# Patient Record
Sex: Female | Born: 1960 | Race: White | Hispanic: No | Marital: Single | Smoking: Never smoker
Health system: Southern US, Community
[De-identification: ages and names within clinical notes are randomized; demographics above are authoritative.]

## PROBLEM LIST (undated history)

## (undated) DIAGNOSIS — M1990 Unspecified osteoarthritis, unspecified site: Secondary | ICD-10-CM

## (undated) DIAGNOSIS — E119 Type 2 diabetes mellitus without complications: Secondary | ICD-10-CM

## (undated) DIAGNOSIS — T7840XA Allergy, unspecified, initial encounter: Secondary | ICD-10-CM

## (undated) HISTORY — DX: Type 2 diabetes mellitus without complications: E11.9

## (undated) HISTORY — PX: WISDOM TOOTH EXTRACTION: SHX21

## (undated) HISTORY — PX: NO PAST SURGERIES: SHX2092

## (undated) HISTORY — DX: Unspecified osteoarthritis, unspecified site: M19.90

## (undated) HISTORY — DX: Allergy, unspecified, initial encounter: T78.40XA

---

## 1997-08-20 ENCOUNTER — Other Ambulatory Visit: Admission: RE | Admit: 1997-08-20 | Discharge: 1997-08-20 | Payer: Self-pay | Admitting: *Deleted

## 1998-10-05 ENCOUNTER — Ambulatory Visit (HOSPITAL_COMMUNITY): Admission: RE | Admit: 1998-10-05 | Discharge: 1998-10-05 | Payer: Self-pay | Admitting: *Deleted

## 1998-10-23 ENCOUNTER — Other Ambulatory Visit: Admission: RE | Admit: 1998-10-23 | Discharge: 1998-10-23 | Payer: Self-pay | Admitting: *Deleted

## 2003-11-19 ENCOUNTER — Ambulatory Visit (HOSPITAL_COMMUNITY): Admission: RE | Admit: 2003-11-19 | Discharge: 2003-11-19 | Payer: Self-pay | Admitting: Internal Medicine

## 2007-02-13 ENCOUNTER — Ambulatory Visit: Payer: Self-pay | Admitting: Internal Medicine

## 2007-02-13 LAB — CONVERTED CEMR LAB
AFP-Tumor Marker: 2.4 ng/mL (ref 0.0–8.0)
ALT: 61 units/L — ABNORMAL HIGH (ref 0–35)
AST: 35 units/L (ref 0–37)
Albumin: 3.9 g/dL (ref 3.5–5.2)
Alkaline Phosphatase: 78 units/L (ref 39–117)
Bilirubin, Direct: 0.1 mg/dL (ref 0.0–0.3)
Ferritin: 109.9 ng/mL (ref 10.0–291.0)
HCV Ab: NEGATIVE
Hep B S Ab: NEGATIVE
Hepatitis B Surface Ag: NEGATIVE
IgG (Immunoglobin G), Serum: 832 mg/dL (ref 694–1618)
IgM, Serum: 149 mg/dL (ref 60–263)
Total Bilirubin: 0.6 mg/dL (ref 0.3–1.2)
Total Protein: 7.2 g/dL (ref 6.0–8.3)
ds DNA Ab: <1 (ref <5)

## 2007-02-16 ENCOUNTER — Ambulatory Visit (HOSPITAL_COMMUNITY): Admission: RE | Admit: 2007-02-16 | Discharge: 2007-02-16 | Payer: Self-pay | Admitting: Internal Medicine

## 2007-03-27 ENCOUNTER — Ambulatory Visit: Payer: Self-pay | Admitting: Internal Medicine

## 2007-07-11 DIAGNOSIS — R945 Abnormal results of liver function studies: Secondary | ICD-10-CM | POA: Insufficient documentation

## 2007-07-11 DIAGNOSIS — J301 Allergic rhinitis due to pollen: Secondary | ICD-10-CM | POA: Insufficient documentation

## 2007-07-11 DIAGNOSIS — K7689 Other specified diseases of liver: Secondary | ICD-10-CM | POA: Insufficient documentation

## 2007-10-11 ENCOUNTER — Ambulatory Visit (HOSPITAL_COMMUNITY): Admission: RE | Admit: 2007-10-11 | Discharge: 2007-10-11 | Payer: Self-pay | Admitting: Obstetrics and Gynecology

## 2010-07-30 ENCOUNTER — Other Ambulatory Visit (HOSPITAL_COMMUNITY): Payer: Self-pay | Admitting: Obstetrics and Gynecology

## 2010-09-28 NOTE — Assessment & Plan Note (Signed)
[facility]                         GASTROENTEROLOGY OFFICE NOTE   NAME:Mathieson, LAMANDA RUDDER                  MRN:          [id number]  DATE:03/27/2007                            DOB:          07-Jan-1961    SUBJECTIVE:  Ms. Amodio is a very nice 50 year old white female  patient of Dr. Georgia Lopes who was initially referred for evaluation of  abnormal liver function tests.  My initial consultation was dictated on  February 13, 2007.  Since then, the patient has had many liver function  tests pertaining to hereditary autoimmune or genetic diseases.  All of  these tests have been negative including hepatitis A, B and C  serologies.  She initially had a positive ANA titer 1:125 but her anti-  DNA antibody was negative and her total IgG level was normal.  Also, her  liver function tests have improved markedly to normal AST and ALT of  only 61.  The patient remains asymptomatic.  The upper abdominal  ultrasound confirmed presence of fatty infiltration of the liver with  normal gallbladder, no focal lesions and no evidence of portal  hypertension.   PHYSICAL EXAMINATION:  VITAL SIGNS:  Blood pressure 136/94. Pulse 112.  Weight 187 pounds.  GENERAL APPEARANCE: Patient was in no distress.  There were no stigmata  of chronic liver disease.  ABDOMEN:  Soft, nontender with liver edge at costal margin, distended  about 9 to 10 cm.  Splenic tip not palpable.  There was no tenderness  overlying the liver.   IMPRESSION:  A 50 year old white female with abnormal liver function  tests which have improved.  She has mild fatty liver without evidence of  significant hepatocellular dysfunction.  Her immune parameters are  negative including IgG, antismooth muscle antibody, antimitochondrial  antibody, and anti-DNA titer.  She has low positive titer of ANA.  Even  if she had a mild component of autoimmune liver disease, her liver  function abnormalities are not  severe enough to use immunomodulators  such as Imuran.   PLAN:  1. I would favor following her liver function tests over the next two      years, every six months, and follow the transaminases as to      fluctuation.  2. I asked the patient to lose about 20 pounds  She has started      walking and trying to lose weight.  She will also be following a      low fat carbohydrate-modified diet to improve her lipid profile.  3. I will see her in the office in about a year.  4. It is entirely possible that we will end up doing a liver biopsy at      some point should her liver function tests deteriorate again.     Hedwig Morton. Juanda Chance, MD  Electronically Signed   DMB/MedQ  DD: 03/27/2007  DT: 03/28/2007  Job #: [id number]   cc:   Elvina Sidle, M.D.

## 2010-09-28 NOTE — Assessment & Plan Note (Signed)
[facility]                         GASTROENTEROLOGY OFFICE NOTE   NAME:THORNHILLStarlyn, Droge                  MRN:          [id number]  DATE:02/13/2007                            DOB:          1960-07-08    Ms. Ayars is a very nice 50 -year-old patient of Dr.  Georgia Lopes  who is referred here because of abnormal transaminases which were found  on December 02, 2006, while the patient was evaluated for a rash. The  etiology of the rash was never clearly found, but the patient was found  to have a positive ANA titer of 125. The rash has since then has  disappeared and the patient has no residual of lesions. Her AST was 78  and ALT was 108 with normal alkaline phosphatase of 112 and normal total  and direct bilirubin as well as normal serum albumin of 4.6. The patient  has no specific symptoms. Her level of energy has been good. She denies  jaundice, easy bruising, abdominal pains. She denies any arthralgias.  There is no family history of liver disease or gallbladder disease. She  has never received blood transfusions and has never donated blood. She  has no GI symptoms of food intolerance or dyspepsia. She has really  never had any comprehensive blood tests since she has never been  pregnant and then the only time she saw a physician was at age of 82 when  she ran though a glass door and needed some stitches.   CURRENT MEDICATIONS:  1. Viactiv one daily.  2. Potassium over-the-counter.  3. Generic Claritin D.  4. She has never taken a non-steroidal agent, Tylenol or aspirin.  5. She in the past took vitamin E, but did not take excess vitamin A.   PAST MEDICAL HISTORY:  Only significant for allergies and sinus trouble  and question of elevated blood pressure for which she was given a  prescription but has not had it filled yet.   FAMILY HISTORY:  Negative for colon cancer. Positive for heart disease  in her mother who also had a kidney cancer and had  a nephrectomy.   SOCIAL HISTORY:  She is single with four years of college. Never had any  children. She works as a Catering manager. She drinks alcohol only six times a  year and does not smoke.   REVIEW OF SYSTEMS:  Weight is stable. She __________ skin rash which she  has attributed to allergies or contact dermatitis.   PHYSICAL EXAMINATION:  Blood pressure 112/80, pulse 96 and weight 190  pounds. The patient was alert, oriented in no distress. Very  cooperative.  SKIN: Was warm and dry with two red spots on her back which blanch with  pressure and both questionably consistent with AVMs. There was no palmar  erythema and no other AV malformations.  Oral cavity was normal.  NECK: Supple __________ adenopathy.  Sclerae was not icteric.  LUNGS:  Clear to auscultation.  COR: With normal S1, normal S2.  ABDOMEN: Soft, nontender. No scars. Bowel sounds were normoactive to  somewhat hypoactive. Liver edge by percussion was at costal margin. By  percussion, liver  span was about 9-cm. There was no pain on pounding of  the liver. Splenic tip not palpable.  EXTREMITIES: No edema.   IMPRESSION:  A 50 year old white female with abnormal transaminases, ALT  greater than AST on December 02, 2006. She has no symptoms of liver disease  and no stigmata of chronic liver disease on physical examination. She  has positive serology marker for autoimmune disease with ANA titer of  __________ 125. We need to rule out possibility of autoimmune liver  disease. She does not show any stigmata of portal hypertension or  cirrhosis. Need to rule out hepatitis A, B, C or hereditary liver  diseases such as alpha-1 antitrypsin deficiency, heterozygous state or  hemachromatosis heterozygous state.   PLAN:  1. Upper abdominal ultrasound to visualize the liver and rule out      space occupying lesion.  2. Repeat the liver function tests today. Obtain anti-DNA, IgG level      as well as IgM level, __________ antibody and  smooth muscle      antibodies, pro-thrombin time as well as __________ level. She will      come back in about six weeks to discuss results of her liver tests      and her ultrasound. Depending on the results, we will take further      action, either follow the liver function tests or proceed with      liver biopsies.     Hedwig Morton. Juanda Chance, MD  Electronically Signed    DMB/MedQ  DD: 02/13/2007  DT: 02/13/2007  Job #: [id number]   cc:   Elvina Sidle, M.D.

## 2010-10-29 ENCOUNTER — Telehealth: Payer: Self-pay | Admitting: Internal Medicine

## 2010-10-29 NOTE — Telephone Encounter (Signed)
Patient calling to report she has noticed a "tender spot in middle of stomach" that is sore when pushed on for the last few weeks. She had lots of sneezing yesterday due to allergies and it is more sore today. Reports a 24 hour episode, of liquid diarrhea and bloating last week but no problems since then. Patient instructed to see PCP or Urgent Care to evaluate and then see GI if needed.

## 2010-10-29 NOTE — Telephone Encounter (Signed)
I agree , this is not a specific GI problem

## 2011-01-11 ENCOUNTER — Other Ambulatory Visit (HOSPITAL_COMMUNITY): Payer: Self-pay | Admitting: Obstetrics and Gynecology

## 2011-01-11 DIAGNOSIS — Z1231 Encounter for screening mammogram for malignant neoplasm of breast: Secondary | ICD-10-CM

## 2011-01-13 ENCOUNTER — Ambulatory Visit (HOSPITAL_COMMUNITY)
Admission: RE | Admit: 2011-01-13 | Discharge: 2011-01-13 | Disposition: A | Discharge status: Home / Self Care | Payer: BC Managed Care – PPO | Source: Ambulatory Visit | Attending: Obstetrics and Gynecology | Admitting: Obstetrics and Gynecology

## 2011-01-13 DIAGNOSIS — Z1231 Encounter for screening mammogram for malignant neoplasm of breast: Secondary | ICD-10-CM | POA: Insufficient documentation

## 2012-07-06 ENCOUNTER — Other Ambulatory Visit: Payer: Self-pay | Admitting: Obstetrics and Gynecology

## 2012-07-06 DIAGNOSIS — Z1231 Encounter for screening mammogram for malignant neoplasm of breast: Secondary | ICD-10-CM

## 2012-08-01 ENCOUNTER — Ambulatory Visit
Admission: RE | Admit: 2012-08-01 | Discharge: 2012-08-01 | Disposition: A | Discharge status: Home / Self Care | Payer: BC Managed Care – PPO | Source: Ambulatory Visit | Attending: Obstetrics and Gynecology | Admitting: Obstetrics and Gynecology

## 2012-08-01 DIAGNOSIS — Z1231 Encounter for screening mammogram for malignant neoplasm of breast: Secondary | ICD-10-CM

## 2014-05-15 LAB — TSH: TSH: 1.65 u[IU]/mL (ref 0.41–5.90)

## 2014-08-15 ENCOUNTER — Encounter: Payer: Self-pay | Admitting: Family

## 2014-08-15 ENCOUNTER — Ambulatory Visit (INDEPENDENT_AMBULATORY_CARE_PROVIDER_SITE_OTHER): Payer: BLUE CROSS/BLUE SHIELD | Admitting: Family

## 2014-08-15 ENCOUNTER — Other Ambulatory Visit: Payer: Self-pay

## 2014-08-15 ENCOUNTER — Other Ambulatory Visit (INDEPENDENT_AMBULATORY_CARE_PROVIDER_SITE_OTHER): Payer: BLUE CROSS/BLUE SHIELD

## 2014-08-15 VITALS — BP 144/100 | HR 84 | Temp 98.1°F | Ht 68.75 in | Wt 175.2 lb

## 2014-08-15 DIAGNOSIS — E559 Vitamin D deficiency, unspecified: Secondary | ICD-10-CM

## 2014-08-15 DIAGNOSIS — L659 Nonscarring hair loss, unspecified: Secondary | ICD-10-CM | POA: Diagnosis not present

## 2014-08-15 DIAGNOSIS — E119 Type 2 diabetes mellitus without complications: Secondary | ICD-10-CM | POA: Diagnosis not present

## 2014-08-15 DIAGNOSIS — R030 Elevated blood-pressure reading, without diagnosis of hypertension: Secondary | ICD-10-CM | POA: Insufficient documentation

## 2014-08-15 DIAGNOSIS — I10 Essential (primary) hypertension: Secondary | ICD-10-CM

## 2014-08-15 DIAGNOSIS — IMO0001 Reserved for inherently not codable concepts without codable children: Secondary | ICD-10-CM

## 2014-08-15 LAB — COMPREHENSIVE METABOLIC PANEL
ALT: 13 U/L (ref 0–35)
AST: 12 U/L (ref 0–37)
Albumin: 4.4 g/dL (ref 3.5–5.2)
Alkaline Phosphatase: 56 U/L (ref 39–117)
BILIRUBIN TOTAL: 0.4 mg/dL (ref 0.2–1.2)
BUN: 9 mg/dL (ref 6–23)
CALCIUM: 9.6 mg/dL (ref 8.4–10.5)
CO2: 30 mEq/L (ref 19–32)
CREATININE: 0.72 mg/dL (ref 0.40–1.20)
Chloride: 104 mEq/L (ref 96–112)
GFR: 90.52 mL/min (ref >60.00)
GLUCOSE: 143 mg/dL — AB (ref 70–99)
POTASSIUM: 4.1 meq/L (ref 3.5–5.1)
Sodium: 137 mEq/L (ref 135–145)
TOTAL PROTEIN: 7 g/dL (ref 6.0–8.3)

## 2014-08-15 LAB — TSH: TSH: 1.52 u[IU]/mL (ref 0.35–4.50)

## 2014-08-15 LAB — HEMOGLOBIN A1C: Hgb A1c MFr Bld: 6.8 % — ABNORMAL HIGH (ref 4.6–6.5)

## 2014-08-15 MED ORDER — METFORMIN HCL ER (MOD) 500 MG PO TB24: 500.0000 mg | ORAL_TABLET | Freq: Every day | ORAL | Status: DC

## 2014-08-15 MED ORDER — METFORMIN HCL 500 MG PO TABS: 500.0000 mg | ORAL_TABLET | Freq: Three times a day (TID) | ORAL | Status: DC

## 2014-08-15 NOTE — Assessment & Plan Note (Signed)
Patient noted to have increased systolic blood pressure. Continue to monitor at next visit and if it remains high, will start blood pressure medication.

## 2014-08-15 NOTE — Telephone Encounter (Signed)
[facility] is on the phone. Rx for metformin will cost pt too much (2700.00 for 90 day).   They suggest the pended medication  instead. Please advise if this is okay to switch.

## 2014-08-15 NOTE — Assessment & Plan Note (Signed)
Increased hair loss of idiopathic origin. Discussed potential underlying medical conditions including diabetes, thyroid, and iron deficiency. Obtain lab work to rule out metabolic causes. Start Biotin for hair support. If no identifiable cause, consider referral to dermatology.

## 2014-08-15 NOTE — Progress Notes (Signed)
Pre visit review using our clinic review tool, if applicable. No additional management support is needed unless otherwise documented below in the visit note. 

## 2014-08-15 NOTE — Progress Notes (Addendum)
   Subjective:    Patient ID: Deanna Hunter, female    DOB: 1960/10/14, 54 y.o.   MRN: [id number]  Chief Complaint  Patient presents with  . Establish Care    Hair loss, Diabetes     HPI:  Deanna Hunter is a 54 y.o. female who presents today to establish care and discuss hair loss and diabetes.   1) Diabetes - Previously diagnosed with diabetes and believes her last A1c to be 3 years ago. Has been about 2 years since her last eye exam but she is planning on follow up shortly.   2) Loss of hair - Associated symptom of hair loss has increased in the last 6 months. The intensity of the hair loss is enough to clog her drains on occasion. Has changed shampoos to help, which did not provide much change. Recent blood work from December 2015 was reviewed and showed TSH was 1.6.  3) Vitamin D Deficiency - Previous history of Vitamin D deficinency with recent lab work showing it to be low.    No Known Allergies   No current outpatient prescriptions on file prior to visit.   No current facility-administered medications on file prior to visit.    Past Medical History  Diagnosis Date  . Diabetes mellitus without complication     History reviewed. No pertinent past surgical history.  Family History  Problem Relation Age of Onset  . Arthritis Mother   . Hyperlipidemia Mother   . Heart disease Mother   . Hypertension Mother   . Stroke Father   . Hypertension Father     History   Social History  . Marital Status: Single    Spouse Name: N/A  . Number of Children: N/A  . Years of Education: N/A   Occupational History  . Not on file.   Social History Main Topics  . Smoking status: Never Smoker   . Smokeless tobacco: Never Used  . Alcohol Use: No  . Drug Use: No  . Sexual Activity: Not on file   Other Topics Concern  . Not on file   Social History Narrative    Review of Systems  Eyes:       Denies changes in vision  Respiratory: Negative for shortness  of breath.   Endocrine: Negative for polydipsia, polyphagia and polyuria.      Objective:    BP 144/100 mmHg  Pulse 84  Temp(Src) 98.1 F (36.7 C) (Oral)  Ht 5' 8.75" (1.746 m)  Wt 175 lb 4 oz (79.493 kg)  BMI 26.08 kg/m2  SpO2 99% Nursing note and vital signs reviewed.  Physical Exam  Constitutional: She is oriented to person, place, and time. She appears well-developed and well-nourished. No distress.  Cardiovascular: Normal rate, regular rhythm, normal heart sounds and intact distal pulses.   Pulmonary/Chest: Effort normal and breath sounds normal.  Neurological: She is alert and oriented to person, place, and time. She has normal reflexes. No cranial nerve deficit. Coordination normal.  Skin: Skin is warm and dry.  Psychiatric: She has a normal mood and affect. Her behavior is normal. Judgment and thought content normal.       Assessment & Plan:

## 2014-08-15 NOTE — Assessment & Plan Note (Signed)
Type 2 diabetes of unknown status. Obtain A1c to determine current status. Obtain complete metabolic panel check kidney and liver function. Patient is due for eye exam, which is on her schedule to complete. Restart metformin 500 mg 3 times a day with meals. Follow-up pending lab work.

## 2014-08-15 NOTE — Assessment & Plan Note (Signed)
Patient previous history of vitamin D deficiency. She is currently taking multivitamins, however recent lab work reviewed showed low vitamin D levels. Recheck vitamin D. If levels remain low we'll start Drisdol.

## 2014-08-15 NOTE — Patient Instructions (Signed)
Thank you for choosing  HealthCare.  Summary/Instructions:  Your prescription(s) have been submitted to your pharmacy or been printed and provided for you. Please take as directed and contact our office if you believe you are having problem(s) with the medication(s) or have any questions.  Please stop by the lab on the basement level of the building for your blood work. Your results will be released to MyChart (or called to you) after review, usually within 72 hours after test completion. If any changes need to be made, you will be notified at that same time.  If your symptoms worsen or fail to improve, please contact our office for further instruction, or in case of emergency go directly to the emergency room at the closest medical facility.     

## 2014-08-19 LAB — VITAMIN D 1,25 DIHYDROXY
Vitamin D 1, 25 (OH)2 Total: 40 pg/mL (ref 18–72)
Vitamin D3 1, 25 (OH)2: 40 pg/mL

## 2014-08-20 ENCOUNTER — Encounter: Payer: Self-pay | Admitting: Family

## 2014-08-21 ENCOUNTER — Other Ambulatory Visit: Payer: Self-pay | Admitting: Obstetrics and Gynecology

## 2014-08-21 DIAGNOSIS — R928 Other abnormal and inconclusive findings on diagnostic imaging of breast: Secondary | ICD-10-CM

## 2014-08-26 ENCOUNTER — Ambulatory Visit
Admission: RE | Admit: 2014-08-26 | Discharge: 2014-08-26 | Disposition: A | Discharge status: Home / Self Care | Payer: BLUE CROSS/BLUE SHIELD | Source: Ambulatory Visit | Attending: Obstetrics and Gynecology | Admitting: Obstetrics and Gynecology

## 2014-08-26 DIAGNOSIS — R928 Other abnormal and inconclusive findings on diagnostic imaging of breast: Secondary | ICD-10-CM

## 2015-08-03 ENCOUNTER — Other Ambulatory Visit: Payer: Self-pay | Admitting: Family

## 2015-08-15 ENCOUNTER — Other Ambulatory Visit: Payer: Self-pay | Admitting: Family

## 2015-10-22 DIAGNOSIS — Z01419 Encounter for gynecological examination (general) (routine) without abnormal findings: Secondary | ICD-10-CM | POA: Diagnosis not present

## 2015-10-22 DIAGNOSIS — Z1151 Encounter for screening for human papillomavirus (HPV): Secondary | ICD-10-CM | POA: Diagnosis not present

## 2015-10-22 DIAGNOSIS — R350 Frequency of micturition: Secondary | ICD-10-CM | POA: Diagnosis not present

## 2015-10-22 DIAGNOSIS — Z6827 Body mass index (BMI) 27.0-27.9, adult: Secondary | ICD-10-CM | POA: Diagnosis not present

## 2015-10-22 DIAGNOSIS — R87810 Cervical high risk human papillomavirus (HPV) DNA test positive: Secondary | ICD-10-CM | POA: Diagnosis not present

## 2015-10-22 DIAGNOSIS — R300 Dysuria: Secondary | ICD-10-CM | POA: Diagnosis not present

## 2015-11-03 ENCOUNTER — Telehealth: Payer: Self-pay | Admitting: Internal Medicine

## 2015-11-03 ENCOUNTER — Encounter: Payer: Self-pay | Admitting: Internal Medicine

## 2015-11-03 NOTE — Telephone Encounter (Signed)
Colonoscopy scheduled.

## 2015-11-03 NOTE — Telephone Encounter (Signed)
ok 

## 2015-12-28 ENCOUNTER — Ambulatory Visit (AMBULATORY_SURGERY_CENTER): Payer: Self-pay | Admitting: *Deleted

## 2015-12-28 VITALS — Ht 68.0 in | Wt 181.0 lb

## 2015-12-28 DIAGNOSIS — Z1211 Encounter for screening for malignant neoplasm of colon: Secondary | ICD-10-CM

## 2015-12-28 MED ORDER — NA SULFATE-K SULFATE-MG SULF 17.5-3.13-1.6 GM/177ML PO SOLN: 1.0000 | Freq: Once | ORAL | 0 refills | Status: AC

## 2015-12-28 NOTE — Progress Notes (Signed)
No egg or soy allergy known to patient  Nopast sedation with any surgeries  or procedures, no past  intubation  No diet pills per patient No home 02 use per patient  No blood thinners per patient  Pt denies issues with constipation

## 2015-12-29 ENCOUNTER — Encounter: Payer: Self-pay | Admitting: Internal Medicine

## 2016-01-11 ENCOUNTER — Encounter: Payer: Self-pay | Admitting: Internal Medicine

## 2016-01-22 DIAGNOSIS — E559 Vitamin D deficiency, unspecified: Secondary | ICD-10-CM | POA: Diagnosis not present

## 2016-01-22 DIAGNOSIS — G4700 Insomnia, unspecified: Secondary | ICD-10-CM | POA: Diagnosis not present

## 2016-01-22 DIAGNOSIS — Z79899 Other long term (current) drug therapy: Secondary | ICD-10-CM | POA: Diagnosis not present

## 2016-01-22 DIAGNOSIS — N951 Menopausal and female climacteric states: Secondary | ICD-10-CM | POA: Diagnosis not present

## 2016-01-22 DIAGNOSIS — J309 Allergic rhinitis, unspecified: Secondary | ICD-10-CM | POA: Diagnosis not present

## 2016-03-17 DIAGNOSIS — E559 Vitamin D deficiency, unspecified: Secondary | ICD-10-CM | POA: Diagnosis not present

## 2016-03-17 DIAGNOSIS — N951 Menopausal and female climacteric states: Secondary | ICD-10-CM | POA: Diagnosis not present

## 2016-07-28 ENCOUNTER — Other Ambulatory Visit: Payer: Self-pay | Admitting: Obstetrics and Gynecology

## 2016-07-28 DIAGNOSIS — Z1231 Encounter for screening mammogram for malignant neoplasm of breast: Secondary | ICD-10-CM

## 2016-08-12 ENCOUNTER — Other Ambulatory Visit: Payer: Self-pay | Admitting: Family

## 2016-08-19 ENCOUNTER — Ambulatory Visit
Admission: RE | Admit: 2016-08-19 | Discharge: 2016-08-19 | Disposition: A | Discharge status: Home / Self Care | Payer: BLUE CROSS/BLUE SHIELD | Source: Ambulatory Visit | Attending: Obstetrics and Gynecology | Admitting: Obstetrics and Gynecology

## 2016-08-19 DIAGNOSIS — Z1231 Encounter for screening mammogram for malignant neoplasm of breast: Secondary | ICD-10-CM | POA: Diagnosis not present

## 2016-08-23 ENCOUNTER — Other Ambulatory Visit: Payer: Self-pay | Admitting: Obstetrics and Gynecology

## 2016-08-23 DIAGNOSIS — R928 Other abnormal and inconclusive findings on diagnostic imaging of breast: Secondary | ICD-10-CM

## 2016-08-25 ENCOUNTER — Ambulatory Visit
Admission: RE | Admit: 2016-08-25 | Discharge: 2016-08-25 | Disposition: A | Discharge status: Home / Self Care | Payer: BLUE CROSS/BLUE SHIELD | Source: Ambulatory Visit | Attending: Obstetrics and Gynecology | Admitting: Obstetrics and Gynecology

## 2016-08-25 DIAGNOSIS — R928 Other abnormal and inconclusive findings on diagnostic imaging of breast: Secondary | ICD-10-CM

## 2016-08-25 DIAGNOSIS — N6489 Other specified disorders of breast: Secondary | ICD-10-CM | POA: Diagnosis not present

## 2016-10-31 ENCOUNTER — Other Ambulatory Visit: Payer: Self-pay | Admitting: Family

## 2016-11-02 ENCOUNTER — Other Ambulatory Visit: Payer: Self-pay | Admitting: Family

## 2016-11-06 ENCOUNTER — Emergency Department (HOSPITAL_COMMUNITY)
Admission: EM | Admit: 2016-11-06 | Discharge: 2016-11-06 | Disposition: A | Discharge status: Home / Self Care | Payer: BLUE CROSS/BLUE SHIELD | Attending: Emergency Medicine | Admitting: Emergency Medicine

## 2016-11-06 ENCOUNTER — Encounter (HOSPITAL_COMMUNITY): Payer: Self-pay | Admitting: Emergency Medicine

## 2016-11-06 ENCOUNTER — Emergency Department (HOSPITAL_COMMUNITY): Payer: BLUE CROSS/BLUE SHIELD

## 2016-11-06 DIAGNOSIS — Z79899 Other long term (current) drug therapy: Secondary | ICD-10-CM | POA: Diagnosis not present

## 2016-11-06 DIAGNOSIS — Z7984 Long term (current) use of oral hypoglycemic drugs: Secondary | ICD-10-CM | POA: Insufficient documentation

## 2016-11-06 DIAGNOSIS — J40 Bronchitis, not specified as acute or chronic: Secondary | ICD-10-CM | POA: Diagnosis not present

## 2016-11-06 DIAGNOSIS — E119 Type 2 diabetes mellitus without complications: Secondary | ICD-10-CM | POA: Diagnosis not present

## 2016-11-06 DIAGNOSIS — Z7982 Long term (current) use of aspirin: Secondary | ICD-10-CM | POA: Insufficient documentation

## 2016-11-06 DIAGNOSIS — R509 Fever, unspecified: Secondary | ICD-10-CM | POA: Diagnosis not present

## 2016-11-06 DIAGNOSIS — R05 Cough: Secondary | ICD-10-CM | POA: Diagnosis not present

## 2016-11-06 DIAGNOSIS — R000 Tachycardia, unspecified: Secondary | ICD-10-CM | POA: Diagnosis not present

## 2016-11-06 DIAGNOSIS — R0602 Shortness of breath: Secondary | ICD-10-CM | POA: Diagnosis not present

## 2016-11-06 LAB — URINALYSIS, ROUTINE W REFLEX MICROSCOPIC
BACTERIA UA: NONE SEEN
Bilirubin Urine: NEGATIVE
Glucose, UA: >=500 mg/dL — AB
Hgb urine dipstick: NEGATIVE
Ketones, ur: 20 mg/dL — AB
Leukocytes, UA: NEGATIVE
Nitrite: NEGATIVE
PROTEIN: NEGATIVE mg/dL
Specific Gravity, Urine: >1.046 — ABNORMAL HIGH (ref 1.005–1.030)
pH: 5 (ref 5.0–8.0)

## 2016-11-06 LAB — COMPREHENSIVE METABOLIC PANEL
ALK PHOS: 70 U/L (ref 38–126)
ALT: 31 U/L (ref 14–54)
ANION GAP: 9 (ref 5–15)
AST: 22 U/L (ref 15–41)
Albumin: 4.4 g/dL (ref 3.5–5.0)
BILIRUBIN TOTAL: 0.5 mg/dL (ref 0.3–1.2)
BUN: 9 mg/dL (ref 6–20)
CALCIUM: 9.1 mg/dL (ref 8.9–10.3)
CO2: 24 mmol/L (ref 22–32)
Chloride: 102 mmol/L (ref 101–111)
Creatinine, Ser: 0.77 mg/dL (ref 0.44–1.00)
GFR calc Af Amer: >60 mL/min (ref >60)
GFR calc non Af Amer: >60 mL/min (ref >60)
GLUCOSE: 244 mg/dL — AB (ref 65–99)
Potassium: 3.8 mmol/L (ref 3.5–5.1)
SODIUM: 135 mmol/L (ref 135–145)
TOTAL PROTEIN: 7.5 g/dL (ref 6.5–8.1)

## 2016-11-06 LAB — CBC WITH DIFFERENTIAL/PLATELET
BASOS ABS: 0 10*3/uL (ref 0.0–0.1)
Basophils Relative: 0 %
EOS ABS: 0 10*3/uL (ref 0.0–0.7)
Eosinophils Relative: 0 %
HCT: 43.8 % (ref 36.0–46.0)
Hemoglobin: 15.2 g/dL — ABNORMAL HIGH (ref 12.0–15.0)
LYMPHS ABS: 0.6 10*3/uL — AB (ref 0.7–4.0)
Lymphocytes Relative: 7 %
MCH: 29.4 pg (ref 26.0–34.0)
MCHC: 34.7 g/dL (ref 30.0–36.0)
MCV: 84.7 fL (ref 78.0–100.0)
MONO ABS: 0.4 10*3/uL (ref 0.1–1.0)
Monocytes Relative: 5 %
Neutro Abs: 8.1 10*3/uL — ABNORMAL HIGH (ref 1.7–7.7)
Neutrophils Relative %: 88 %
Platelets: 203 10*3/uL (ref 150–400)
RBC: 5.17 MIL/uL — AB (ref 3.87–5.11)
RDW: 12.7 % (ref 11.5–15.5)
WBC: 9.2 10*3/uL (ref 4.0–10.5)

## 2016-11-06 LAB — I-STAT CG4 LACTIC ACID, ED: Lactic Acid, Venous: 1.22 mmol/L (ref 0.5–1.9)

## 2016-11-06 LAB — I-STAT TROPONIN, ED: Troponin i, poc: 0.01 ng/mL (ref 0.00–0.08)

## 2016-11-06 MED ORDER — DOXYCYCLINE HYCLATE 100 MG PO CAPS: 100.0000 mg | ORAL_CAPSULE | Freq: Two times a day (BID) | ORAL | 0 refills | Status: DC

## 2016-11-06 MED ORDER — SODIUM CHLORIDE 0.9 % IV BOLUS (SEPSIS)
1000.0000 mL | Freq: Once | INTRAVENOUS | Status: AC
  Administered 2016-11-06: 1000 mL via INTRAVENOUS

## 2016-11-06 MED ORDER — IOPAMIDOL (ISOVUE-370) INJECTION 76%
100.0000 mL | Freq: Once | INTRAVENOUS | Status: AC | PRN
  Administered 2016-11-06: 100 mL via INTRAVENOUS

## 2016-11-06 MED ORDER — DEXTROSE 5 % IV SOLN
1.0000 g | Freq: Once | INTRAVENOUS | Status: AC
  Administered 2016-11-06: 1 g via INTRAVENOUS
  Filled 2016-11-06: qty 10

## 2016-11-06 MED ORDER — IOPAMIDOL (ISOVUE-370) INJECTION 76%
INTRAVENOUS | Status: AC
  Filled 2016-11-06: qty 100

## 2016-11-06 MED ORDER — DEXTROSE 5 % IV SOLN
500.0000 mg | Freq: Once | INTRAVENOUS | Status: AC
  Administered 2016-11-06: 500 mg via INTRAVENOUS
  Filled 2016-11-06: qty 500

## 2016-11-06 MED ORDER — FLUCONAZOLE 150 MG PO TABS
150.0000 mg | ORAL_TABLET | Freq: Once | ORAL | Status: AC
  Administered 2016-11-06: 150 mg via ORAL
  Filled 2016-11-06: qty 1

## 2016-11-06 MED ORDER — ACETAMINOPHEN 500 MG PO TABS
1000.0000 mg | ORAL_TABLET | Freq: Once | ORAL | Status: AC
  Administered 2016-11-06: 1000 mg via ORAL
  Filled 2016-11-06: qty 2

## 2016-11-06 MED ORDER — SODIUM CHLORIDE 0.9 % IV BOLUS (SEPSIS)
500.0000 mL | Freq: Once | INTRAVENOUS | Status: AC
  Administered 2016-11-06: 500 mL via INTRAVENOUS

## 2016-11-06 NOTE — Discharge Instructions (Signed)
Stay hydrated.   Take tylenol, motrin for fever.   Take doxycycline for possible early pneumonia.   See your doctor this week   Return to ER if you have fever for a week, trouble breathing, worse weakness, shortness of breath, chest pain, vomiting, dehydration

## 2016-11-06 NOTE — ED Notes (Signed)
Pt request that she get diflucan because she is getting abx and usually needs for tx

## 2016-11-06 NOTE — ED Triage Notes (Signed)
Pt reports she began to have a cough and fever last night. Took an antipyretic at that time. Since then has had generalized weakness. HR 150 in triage. Pt sweaty.

## 2016-11-06 NOTE — ED Provider Notes (Signed)
[facility] DEPT Provider Note   CSN: [id number] Arrival date & time: 11/06/16  1130     History   Chief Complaint Chief Complaint  Patient presents with  . Weakness  . Tachycardia    HPI Deanna Hunter is a 56 y.o. female history of diabetes, arthritis, here presenting with shortness of breath, palpitation, fever. Patient states that she's been running a fever since yesterday. Fever 101 at home yesterday and last dose of Tylenol was last night. Also has a productive cough as well and just felt weak all over. Has some subjective shortness of breath as well. Patient states that her blood sugar has been running elevated as well but denies any vomiting or abdominal pain or diarrhea. Denies any urinary symptoms.  The history is provided by the patient.    Past Medical History:  Diagnosis Date  . Allergy   . 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 Diabetes mellitus without complication [facility])     Patient Active Problem List   Diagnosis Date Noted  . Type II diabetes mellitus ([place]) 08/15/2014  . Vitamin D deficiency 08/15/2014  . Hair loss 08/15/2014  . Elevated systolic blood pressure 95/01/3266  . ALLERGIC RHINITIS, SEASONAL 07/11/2007  . FATTY LIVER DISEASE 07/11/2007  . LIVER FUNCTION TESTS, ABNORMAL 07/11/2007    Past Surgical History:  Procedure Laterality Date  . NO PAST SURGERIES      OB History    No data available       Home Medications    Prior to Admission medications   Medication Sig Start Date End Date Taking? Authorizing Provider  aspirin 81 MG chewable tablet Chew 81 mg by mouth as needed.   Yes [provider]  metFORMIN (GLUCOPHAGE-XR) 500 MG 24 hr tablet TAKE 1 TABLET THREE TIMES A DAY 08/17/15  Yes Golden Circle, FNP  Multiple Vitamins-Minerals (ALIVE ONCE DAILY WOMENS 50+) TABS Take by mouth.   Yes [provider]  metFORMIN (GLUCOPHAGE) 500 MG tablet Take 1 tablet (500 mg total) by mouth 3 (three) times daily. Patient  not taking: Reported on 11/06/2016 08/15/14   Golden Circle, FNP    Family History Family History  Problem Relation Age of Onset  . Arthritis Mother   . Hyperlipidemia Mother   . Heart disease Mother   . Hypertension Mother   . Colon polyps Mother   . Stroke Father   . Hypertension Father   . Colon polyps Sister   . Colon cancer Neg Hx   . Esophageal cancer Neg Hx   . Rectal cancer Neg Hx   . Stomach cancer Neg Hx     Social History Social History  Substance Use Topics  . Smoking status: Never Smoker  . Smokeless tobacco: Never Used  . Alcohol use No     Allergies   Patient has no known allergies.   Review of Systems Review of Systems  Constitutional: Positive for chills and fever.  Respiratory: Positive for cough and shortness of breath.   Neurological: Positive for weakness.  All other systems reviewed and are negative.    Physical Exam Updated Vital Signs BP (!) 137/91 (BP Location: Left Arm)   Pulse (!) 115   Temp (!) 102.6 F (39.2 C) (Oral)   Resp 20   Ht 5' 7.5" (1.715 m)   Wt 79.4 kg (175 lb)   SpO2 97%   BMI 27.00 kg/m   Physical Exam  Constitutional: She is oriented to person, place,  and time.  Uncomfortable, dehydrated   HENT:  Head: Normocephalic.  MM dry   Eyes: Conjunctivae and EOM are normal. Pupils are equal, round, and reactive to light.  Neck: Normal range of motion. Neck supple.  No meningeal signs   Cardiovascular:  Tachycardic   Pulmonary/Chest:  Diminished bilateral bases, no wheezing   Abdominal: Soft. Bowel sounds are normal. She exhibits no distension. There is no tenderness.  Musculoskeletal: Normal range of motion. She exhibits no edema.  Neurological: She is alert and oriented to person, place, and time. No cranial nerve deficit. Coordination normal.  Skin: Skin is warm.  Psychiatric: She has a normal mood and affect.  Nursing note and vitals reviewed.    ED Treatments / Results  Labs (all labs ordered are  listed, but only abnormal results are displayed) Labs Reviewed  CBC WITH DIFFERENTIAL/PLATELET - Abnormal; Notable for the following:       Result Value   RBC 5.17 (*)    Hemoglobin 15.2 (*)    Neutro Abs 8.1 (*)    Lymphs Abs 0.6 (*)    All other components within normal limits  COMPREHENSIVE METABOLIC PANEL - Abnormal; Notable for the following:    Glucose, Bld 244 (*)    All other components within normal limits  URINALYSIS, ROUTINE W REFLEX MICROSCOPIC - Abnormal; Notable for the following:    Specific Gravity, Urine >1.046 (*)    Glucose, UA >=500 (*)    Ketones, ur 20 (*)    Squamous Epithelial / LPF 0-5 (*)    All other components within normal limits  CULTURE, BLOOD (ROUTINE X 2)  CULTURE, BLOOD (ROUTINE X 2)  URINE CULTURE  I-STAT TROPOININ, ED  I-STAT CG4 LACTIC ACID, ED    EKG  EKG Interpretation  Date/Time:  Sunday November 06 2016 11:46:51 EDT Ventricular Rate:  138 PR Interval:    QRS Duration: 84 QT Interval:  265 QTC Calculation: 402 R Axis:   5 Text Interpretation:  Sinus tachycardia LAE, consider biatrial enlargement Low voltage, precordial leads Probable anteroseptal infarct, old rate slower since previous  Confirmed by Wandra Arthurs ([id number]) on 11/06/2016 11:53:49 AM       Radiology Dg Chest Port 1 View  Result Date: 11/06/2016 CLINICAL DATA:  Shortness of breath. EXAM: PORTABLE CHEST 1 VIEW COMPARISON:  None. FINDINGS: The heart size and mediastinal contours are within normal limits. Both lungs are clear. The visualized skeletal structures are unremarkable. IMPRESSION: Normal chest. Electronically Signed   By: Lorriane Shire M.D.   On: 11/06/2016 12:07    Procedures Procedures (including critical care time)  Medications Ordered in ED Medications  iopamidol (ISOVUE-370) 76 % injection (not administered)  fluconazole (DIFLUCAN) tablet 150 mg (not administered)  sodium chloride 0.9 % bolus 1,000 mL (0 mLs Intravenous Stopped 11/06/16 1259)    And    sodium chloride 0.9 % bolus 1,000 mL (1,000 mLs Intravenous New Bag/Given 11/06/16 1221)    And  sodium chloride 0.9 % bolus 500 mL (0 mLs Intravenous Stopped 11/06/16 1322)  acetaminophen (TYLENOL) tablet 1,000 mg (1,000 mg Oral Given 11/06/16 1222)  cefTRIAXone (ROCEPHIN) 1 g in dextrose 5 % 50 mL IVPB (0 g Intravenous Stopped 11/06/16 1322)  azithromycin (ZITHROMAX) 500 mg in dextrose 5 % 250 mL IVPB (500 mg Intravenous New Bag/Given 11/06/16 1315)  iopamidol (ISOVUE-370) 76 % injection 100 mL (100 mLs Intravenous Contrast Given 11/06/16 1233)     Initial Impression / Assessment and Plan / ED Course  I have reviewed the triage vital signs and the nursing notes.  Pertinent labs & imaging results that were available during my care of the patient were reviewed by me and considered in my medical decision making (see chart for details).    Deanna Hunter is a 56 y.o. female here with cough, fever, shortness of breath. Patient tachycardic 150s, febrile 102. Meets SIRS criteria. Code sepsis initiated. Will get labs, culture, lactate, CXR, UA. Will treat for CAP empirically. The degree of tachycardia seemed out of proportion to fever so consider PE vs pulmonary infarct as well so if CXR clear, consider CT angio.   3:27 PM HR down to 115. Ambulated and felt better. WBC nl. Lactate nl. CXR clear. CT angio showed no PE. UA showed no UTI. Likely viral bronchitis vs viral. Since she is feeling better, I think she can be discharged. Will dc home with doxycyline as it will cover atypical organisms. Told her to stay hydrated and close follow up with PCP.    Final Clinical Impressions(s) / ED Diagnoses   Final diagnoses:  None    New Prescriptions New Prescriptions   No medications on file     Drenda Freeze, MD 11/06/16 1529

## 2016-11-06 NOTE — ED Notes (Signed)
Code Sepsis called

## 2016-11-06 NOTE — ED Notes (Signed)
Bed: CW88 Expected date:  Expected time:  Means of arrival:  Comments: Resus A

## 2016-11-07 ENCOUNTER — Encounter: Payer: Self-pay | Admitting: Family

## 2016-11-07 ENCOUNTER — Other Ambulatory Visit (INDEPENDENT_AMBULATORY_CARE_PROVIDER_SITE_OTHER): Payer: BLUE CROSS/BLUE SHIELD

## 2016-11-07 ENCOUNTER — Telehealth (HOSPITAL_BASED_OUTPATIENT_CLINIC_OR_DEPARTMENT_OTHER): Payer: Self-pay | Admitting: Emergency Medicine

## 2016-11-07 ENCOUNTER — Ambulatory Visit (INDEPENDENT_AMBULATORY_CARE_PROVIDER_SITE_OTHER): Payer: BLUE CROSS/BLUE SHIELD | Admitting: Family

## 2016-11-07 VITALS — BP 134/82 | HR 113 | Temp 98.4°F | Resp 16 | Ht 67.5 in | Wt 189.0 lb

## 2016-11-07 DIAGNOSIS — E119 Type 2 diabetes mellitus without complications: Secondary | ICD-10-CM

## 2016-11-07 DIAGNOSIS — H6981 Other specified disorders of Eustachian tube, right ear: Secondary | ICD-10-CM | POA: Diagnosis not present

## 2016-11-07 DIAGNOSIS — J40 Bronchitis, not specified as acute or chronic: Secondary | ICD-10-CM | POA: Diagnosis not present

## 2016-11-07 DIAGNOSIS — H6980 Other specified disorders of Eustachian tube, unspecified ear: Secondary | ICD-10-CM | POA: Insufficient documentation

## 2016-11-07 LAB — BLOOD CULTURE ID PANEL (REFLEXED)
ACINETOBACTER BAUMANNII: NOT DETECTED
Candida albicans: NOT DETECTED
Candida glabrata: NOT DETECTED
Candida krusei: NOT DETECTED
Candida parapsilosis: NOT DETECTED
Candida tropicalis: NOT DETECTED
ENTEROCOCCUS SPECIES: NOT DETECTED
Enterobacter cloacae complex: NOT DETECTED
Enterobacteriaceae species: NOT DETECTED
Escherichia coli: NOT DETECTED
HAEMOPHILUS INFLUENZAE: NOT DETECTED
Klebsiella oxytoca: NOT DETECTED
Klebsiella pneumoniae: NOT DETECTED
LISTERIA MONOCYTOGENES: NOT DETECTED
METHICILLIN RESISTANCE: NOT DETECTED
Neisseria meningitidis: NOT DETECTED
PSEUDOMONAS AERUGINOSA: NOT DETECTED
Proteus species: NOT DETECTED
SERRATIA MARCESCENS: NOT DETECTED
STAPHYLOCOCCUS AUREUS BCID: NOT DETECTED
STREPTOCOCCUS AGALACTIAE: NOT DETECTED
STREPTOCOCCUS PNEUMONIAE: NOT DETECTED
Staphylococcus species: DETECTED — AB
Streptococcus pyogenes: NOT DETECTED
Streptococcus species: NOT DETECTED

## 2016-11-07 LAB — HEMOGLOBIN A1C: HEMOGLOBIN A1C: 8.1 % — AB (ref 4.6–6.5)

## 2016-11-07 LAB — URINE CULTURE: Culture: NO GROWTH

## 2016-11-07 MED ORDER — AZITHROMYCIN 250 MG PO TABS: ORAL_TABLET | ORAL | 0 refills | Status: DC

## 2016-11-07 MED ORDER — METFORMIN HCL ER 500 MG PO TB24: 1000.0000 mg | ORAL_TABLET | Freq: Two times a day (BID) | ORAL | 0 refills | Status: DC

## 2016-11-07 MED ORDER — FLUCONAZOLE 150 MG PO TABS: 150.0000 mg | ORAL_TABLET | Freq: Once | ORAL | 0 refills | Status: AC

## 2016-11-07 NOTE — Patient Instructions (Signed)
Thank you for choosing [facility].  SUMMARY AND INSTRUCTIONS:  Stop taking the doxycycline. Start taking azithromycin.  Recommend Aleve-D and Flonase for congestion  If your symptoms worsen please let us know.  For diarrhea, consider Imodium as needed.  Medication:  Your prescription(s) have been submitted to your pharmacy or been printed and provided for you. Please take as directed and contact our office if you believe you are having problem(s) with the medication(s) or have any questions.  Labs:  Please stop by the lab on the lower level of the building for your blood work. Your results will be released to [facility] (or called to you) after review, usually within 72 hours after test completion. If any changes need to be made, you will be notified at that same time.  1.) The lab is open from 7:30am to 5:30 pm Monday-Friday 2.) No appointment is necessary 3.) Fasting (if needed) is 6-8 hours after food and drink; black coffee and water are okay   Follow up:  If your symptoms worsen or fail to improve, please contact our office for further instruction, or in case of emergency go directly to the emergency room at the closest medical facility.

## 2016-11-07 NOTE — Assessment & Plan Note (Signed)
New onset ear fullness with exam consistent for Eustachian tube dysfunction. Start Aleve-D and Flonase. No hearing loss with no indication for prednisone. Follow up if symptoms worsen or do not improve.

## 2016-11-07 NOTE — Progress Notes (Signed)
Subjective:    Patient ID: Deanna Hunter, female    DOB: 28-Oct-1960, 56 y.o.   MRN: [id number]  Chief Complaint  Patient presents with  . Follow-up    metformin XR, rx for diflucan, currently on doxy for pneumonia, right ear pain wants it checked    HPI:  Deanna Hunter is a 56 y.o. female who  has a past medical history of Allergy; Arthritis; and Diabetes mellitus without complication ([place]). and presents today for a follow up office visit.  1.) Type 2 diabetes - Currently maintained on metformin XR. Reports taking medication as prescribed and denies adverse side effects. Does not currently check her blood sugars at home. Not following a low carbohydrate diet/intake. No excessive hunger, thirst or urination.   Lab Results  Component Value Date   HGBA1C 8.1 (H) 11/07/2016    2.) Bronchitis - Recently diagnosed with bronchitis and start on doxycyline. Reports taking the medication as prescribed and notes that she has had diarrhea since starting the medication in the last 24 hours. Requesting  3.) Right ear pain - Associated symptom of pain located in her right ear has been going on and off for several weeks. Does have some pressure feelings in her head. No fevers, discharge, or ringing. Notes ears feel stopped up.     No Known Allergies    Outpatient Medications Prior to Visit  Medication Sig Dispense Refill  . aspirin 81 MG chewable tablet Chew 81 mg by mouth as needed.    . Multiple Vitamins-Minerals (ALIVE ONCE DAILY WOMENS 50+) TABS Take by mouth.    . doxycycline (VIBRAMYCIN) 100 MG capsule Take 1 capsule (100 mg total) by mouth 2 (two) times daily. One po bid x 7 days 14 capsule 0  . metFORMIN (GLUCOPHAGE) 500 MG tablet Take 1 tablet (500 mg total) by mouth 3 (three) times daily. 270 tablet 3  . metFORMIN (GLUCOPHAGE-XR) 500 MG 24 hr tablet TAKE 1 TABLET THREE TIMES A DAY 270 tablet 3   No facility-administered medications prior to visit.       Past  Surgical History:  Procedure Laterality Date  . NO PAST SURGERIES        Past Medical History:  Diagnosis Date  . Allergy   . 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 Diabetes mellitus without complication ([place])       Review of Systems  Constitutional: Negative for chills, fatigue and fever.  HENT: Positive for congestion and ear pain. Negative for sinus pain, sinus pressure and sneezing.   Eyes:       Denies changes in vision  Respiratory: Positive for cough. Negative for chest tightness, shortness of breath and wheezing.   Cardiovascular: Negative for chest pain, palpitations and leg swelling.  Endocrine: Negative for polydipsia, polyphagia and polyuria.  Neurological: Negative for numbness.      Objective:    BP 134/82 (BP Location: Left Arm, Patient Position: Sitting, Cuff Size: Large)   Pulse (!) 113   Temp 98.4 F (36.9 C) (Oral)   Resp 16   Ht 5' 7.5" (1.715 m)   Wt 189 lb (85.7 kg)   SpO2 96%   BMI 29.16 kg/m  Nursing note and vital signs reviewed.  Physical Exam  Constitutional: She is oriented to person, place, and time. She appears well-developed and well-nourished. No distress.  HENT:  Right Ear: Hearing, tympanic membrane, external ear and ear canal normal.  Left Ear: Hearing, tympanic membrane, external ear and  ear canal normal.  Nose: Nose normal.  Mouth/Throat: Uvula is midline, oropharynx is clear and moist and mucous membranes are normal.  Cardiovascular: Normal rate, regular rhythm, normal heart sounds and intact distal pulses.   Pulmonary/Chest: Effort normal and breath sounds normal.  Neurological: She is alert and oriented to person, place, and time.  Skin: Skin is warm and dry.  Psychiatric: She has a normal mood and affect. Her behavior is normal. Judgment and thought content normal.       Assessment & Plan:   Problem List Items Addressed This Visit      Respiratory   Bronchitis    Previously diagnosed with bronchitis and started on  doxycyline with new onset diarrhea. Discontinue doxycycline and start Azithromycin. Start diflucan for post-antibiotic candidiasis. Follow up if symptoms worsen or do not improve.         Endocrine   Type II diabetes mellitus ([place]) - Primary    Obtain hemoglobin A1c. Urine completed yesterday with no evidence of protein. Diabetic foot exam completed today. Encouraged to complete diabetic eye exam independently. Not currently maintained on ACE/ARB or statin for CAD risk reduction. Continue current dosage of metformin pending A1c results.       Relevant Orders   Hemoglobin A1c (Completed)     Nervous and Auditory   Eustachian tube dysfunction    New onset ear fullness with exam consistent for Eustachian tube dysfunction. Start Aleve-D and Flonase. No hearing loss with no indication for prednisone. Follow up if symptoms worsen or do not improve.           I have discontinued Deanna Hunter's metFORMIN and doxycycline. I am also having her start on azithromycin and fluconazole. Additionally, I am having her maintain her [place] 50+ and aspirin.   Meds ordered this encounter  Medications  . azithromycin (ZITHROMAX) 250 MG tablet    Sig: Take 2 tablets by mouth for 1 day then 1 tablet daily for 4 days.    Dispense:  6 tablet    Refill:  0    Order Specific Question:   Supervising Provider    Answer:   Pricilla Holm A [[id number]]  . fluconazole (DIFLUCAN) 150 MG tablet    Sig: Take 1 tablet (150 mg total) by mouth once. May repeat in 72 hours if needed.    Dispense:  2 tablet    Refill:  0    Order Specific Question:   Supervising Provider    Answer:   Pricilla Holm A [[id number]]     Follow-up: Return in about 3 months (around 02/07/2017), or if symptoms worsen or fail to improve.  Mauricio Po, FNP

## 2016-11-07 NOTE — Assessment & Plan Note (Signed)
Obtain hemoglobin A1c. Urine completed yesterday with no evidence of protein. Diabetic foot exam completed today. Encouraged to complete diabetic eye exam independently. Not currently maintained on ACE/ARB or statin for CAD risk reduction. Continue current dosage of metformin pending A1c results.

## 2016-11-07 NOTE — Assessment & Plan Note (Signed)
Previously diagnosed with bronchitis and started on doxycyline with new onset diarrhea. Discontinue doxycycline and start Azithromycin. Start diflucan for post-antibiotic candidiasis. Follow up if symptoms worsen or do not improve.

## 2016-11-08 ENCOUNTER — Telehealth: Payer: Self-pay | Admitting: Family

## 2016-11-08 NOTE — Telephone Encounter (Signed)
Informed pt per Dr. Quay Burow that it is a contaminate and as long as there is no active symptoms, which there should not be due to currently being on an antibiotic, then there is no concern. Pt understood.

## 2016-11-08 NOTE — Telephone Encounter (Signed)
Pt received a call from [facility], she had blood work done on 6/24, one results was negetive for staphylococcus     and one results was positive, she was told it is up to greg to decide which one it is. Please advise and call back

## 2016-11-09 LAB — CULTURE, BLOOD (ROUTINE X 2): SPECIAL REQUESTS: ADEQUATE

## 2016-11-10 ENCOUNTER — Telehealth: Payer: Self-pay | Admitting: *Deleted

## 2016-11-10 NOTE — Telephone Encounter (Signed)
Post ED Visit - Positive Culture Follow-up  Culture report reviewed by antimicrobial stewardship pharmacist:  []  Elenor Quinones, Pharm.D. []  Heide Guile, Pharm.D., BCPS AQ-ID [x]  Parks Neptune, Pharm.D., BCPS []  Alycia Rossetti, Pharm.D., BCPS []  [place], Pharm.D., BCPS, AAHIVP []  Legrand Como, Pharm.D., BCPS, AAHIVP []  Salome Arnt, PharmD, BCPS []  Dimitri Ped, PharmD, BCPS []  Vincenza Hews, PharmD, BCPS  Positive blood culture Treated with Doxycycline Hyclate, organism sensitive to the same and no further patient follow-up is required at this time.  Harlon Flor [facility] 11/10/2016, 11:12 AM

## 2016-11-11 LAB — CULTURE, BLOOD (ROUTINE X 2): Culture: NO GROWTH

## 2016-11-25 DIAGNOSIS — N760 Acute vaginitis: Secondary | ICD-10-CM | POA: Diagnosis not present

## 2016-11-25 DIAGNOSIS — B373 Candidiasis of vulva and vagina: Secondary | ICD-10-CM | POA: Diagnosis not present

## 2016-12-12 DIAGNOSIS — Z6828 Body mass index (BMI) 28.0-28.9, adult: Secondary | ICD-10-CM | POA: Diagnosis not present

## 2016-12-12 DIAGNOSIS — Z01419 Encounter for gynecological examination (general) (routine) without abnormal findings: Secondary | ICD-10-CM | POA: Diagnosis not present

## 2016-12-23 ENCOUNTER — Encounter: Payer: Self-pay | Admitting: Internal Medicine

## 2017-01-18 DIAGNOSIS — H5712 Ocular pain, left eye: Secondary | ICD-10-CM | POA: Diagnosis not present

## 2017-01-18 DIAGNOSIS — H5310 Unspecified subjective visual disturbances: Secondary | ICD-10-CM | POA: Diagnosis not present

## 2017-02-05 ENCOUNTER — Other Ambulatory Visit: Payer: Self-pay | Admitting: Family

## 2017-02-27 ENCOUNTER — Ambulatory Visit (AMBULATORY_SURGERY_CENTER): Payer: Self-pay

## 2017-02-27 VITALS — Ht 68.0 in | Wt 181.8 lb

## 2017-02-27 DIAGNOSIS — Z1211 Encounter for screening for malignant neoplasm of colon: Secondary | ICD-10-CM

## 2017-02-27 MED ORDER — SUPREP BOWEL PREP KIT 17.5-3.13-1.6 GM/177ML PO SOLN: 1.0000 | Freq: Once | ORAL | 0 refills | Status: AC

## 2017-02-27 NOTE — Progress Notes (Signed)
No diet meds No allergies to eggs or soy No home oxygen No past exposure to anesthesia  Registered emmi

## 2017-02-28 ENCOUNTER — Encounter: Payer: Self-pay | Admitting: Internal Medicine

## 2017-03-10 ENCOUNTER — Telehealth: Payer: Self-pay | Admitting: Family

## 2017-03-10 MED ORDER — METFORMIN HCL ER 500 MG PO TB24: 1000.0000 mg | ORAL_TABLET | Freq: Two times a day (BID) | ORAL | 1 refills | Status: DC

## 2017-03-10 NOTE — Telephone Encounter (Signed)
Per office policy sent 30 day to local pharmacy until appt.../lmb  

## 2017-03-10 NOTE — Telephone Encounter (Signed)
Pt called asking for a refill of her  metFORMIN (GLUCOPHAGE-XR) 500 MG 24 hr tablet Transfer appt set up for 12/17w/ JJ Please advise  POF

## 2017-03-13 ENCOUNTER — Encounter: Payer: Self-pay | Admitting: Internal Medicine

## 2017-03-13 ENCOUNTER — Ambulatory Visit (AMBULATORY_SURGERY_CENTER): Payer: BLUE CROSS/BLUE SHIELD | Admitting: Internal Medicine

## 2017-03-13 VITALS — BP 148/92 | HR 80 | Temp 97.1°F | Resp 14 | Ht 68.0 in | Wt 181.0 lb

## 2017-03-13 DIAGNOSIS — Z1211 Encounter for screening for malignant neoplasm of colon: Secondary | ICD-10-CM | POA: Diagnosis not present

## 2017-03-13 DIAGNOSIS — Z1212 Encounter for screening for malignant neoplasm of rectum: Secondary | ICD-10-CM

## 2017-03-13 DIAGNOSIS — K635 Polyp of colon: Secondary | ICD-10-CM

## 2017-03-13 DIAGNOSIS — D122 Benign neoplasm of ascending colon: Secondary | ICD-10-CM | POA: Diagnosis not present

## 2017-03-13 DIAGNOSIS — D123 Benign neoplasm of transverse colon: Secondary | ICD-10-CM | POA: Diagnosis not present

## 2017-03-13 MED ORDER — SODIUM CHLORIDE 0.9 % IV SOLN: 500.0000 mL | INTRAVENOUS | Status: DC

## 2017-03-13 NOTE — Patient Instructions (Signed)
**   Handouts given on polyps, diverticulosis, and hemorrhoids **   YOU HAD AN ENDOSCOPIC PROCEDURE TODAY AT THE [facility]:   Refer to the procedure report that was given to you for any specific questions about what was found during the examination.  If the procedure report does not answer your questions, please call your gastroenterologist to clarify.  If you requested that your care partner not be given the details of your procedure findings, then the procedure report has been included in a sealed envelope for you to review at your convenience later.  YOU SHOULD EXPECT: Some feelings of bloating in the abdomen. Passage of more gas than usual.  Walking can help get rid of the air that was put into your GI tract during the procedure and reduce the bloating. If you had a lower endoscopy (such as a colonoscopy or flexible sigmoidoscopy) you may notice spotting of blood in your stool or on the toilet paper. If you underwent a bowel prep for your procedure, you may not have a normal bowel movement for a few days.  Please Note:  You might notice some irritation and congestion in your nose or some drainage.  This is from the oxygen used during your procedure.  There is no need for concern and it should clear up in a day or so.  SYMPTOMS TO REPORT IMMEDIATELY:   Following lower endoscopy (colonoscopy or flexible sigmoidoscopy):  Excessive amounts of blood in the stool  Significant tenderness or worsening of abdominal pains  Swelling of the abdomen that is new, acute  Fever of 100F or higher  For urgent or emergent issues, a gastroenterologist can be reached at any hour by calling [phone].   DIET:  We do recommend a small meal at first, but then you may proceed to your regular diet.  Drink plenty of fluids but you should avoid alcoholic beverages for 24 hours.  ACTIVITY:  You should plan to take it easy for the rest of today and you should NOT DRIVE or use heavy machinery until  tomorrow (because of the sedation medicines used during the test).    FOLLOW UP: Our staff will call the number listed on your records the next business day following your procedure to check on you and address any questions or concerns that you may have regarding the information given to you following your procedure. If we do not reach you, we will leave a message.  However, if you are feeling well and you are not experiencing any problems, there is no need to return our call.  We will assume that you have returned to your regular daily activities without incident.  If any biopsies were taken you will be contacted by phone or by letter within the next 1-3 weeks.  Please call us at [phone] if you have not heard about the biopsies in 3 weeks.    SIGNATURES/CONFIDENTIALITY: You and/or your care partner have signed paperwork which will be entered into your electronic medical record.  These signatures attest to the fact that that the information above on your After Visit Summary has been reviewed and is understood.  Full responsibility of the confidentiality of this discharge information lies with you and/or your care-partner. 

## 2017-03-13 NOTE — Progress Notes (Signed)
Report given to PACU, vss 

## 2017-03-13 NOTE — Progress Notes (Signed)
Called to room to assist during endoscopic procedure.  Patient ID and intended procedure confirmed with present staff. Received instructions for my participation in the procedure from the performing physician.  

## 2017-03-13 NOTE — Op Note (Signed)
[facility] Patient Name: Deanna Hunter Procedure Date: 03/13/2017 8:34 AM MRN: [id number] Endoscopist: Jerene Bears , MD Age: 56 Referring MD:  Date of Birth: Apr 13, 1961 Gender: Female Account #: [id number] Procedure:                Colonoscopy Indications:              Screening for colorectal malignant neoplasm, This                            is the patient's first colonoscopy Medicines:                Monitored Anesthesia Care Procedure:                Pre-Anesthesia Assessment:                           - Prior to the procedure, a History and Physical                            was performed, and patient medications and                            allergies were reviewed. The patient's tolerance of                            previous anesthesia was also reviewed. The risks                            and benefits of the procedure and the sedation                            options and risks were discussed with the patient.                            All questions were answered, and informed consent                            was obtained. Prior Anticoagulants: The patient has                            taken no previous anticoagulant or antiplatelet                            agents. ASA Grade Assessment: II - A patient with                            mild systemic disease. After reviewing the risks                            and benefits, the patient was deemed in                            satisfactory condition to undergo the procedure.  After obtaining informed consent, the colonoscope                            was passed under direct vision. Throughout the                            procedure, the patient's blood pressure, pulse, and                            oxygen saturations were monitored continuously. The                            Colonoscope was introduced through the anus and                            advanced to the the  cecum, identified by                            appendiceal orifice and ileocecal valve. The                            colonoscopy was performed without difficulty. The                            patient tolerated the procedure well. The quality                            of the bowel preparation was good. The ileocecal                            valve, appendiceal orifice, and rectum were                            photographed. Scope In: 8:39:56 AM Scope Out: 8:56:30 AM Scope Withdrawal Time: 0 hours 12 minutes 58 seconds  Total Procedure Duration: 0 hours 16 minutes 34 seconds  Findings:                 The digital rectal exam was normal.                           A 5 mm polyp was found in the ascending colon. The                            polyp was flat. The polyp was removed with a cold                            snare. Resection and retrieval were complete.                           A 6 mm polyp was found in the hepatic flexure. The                            polyp was flat. The polyp was  removed with a cold                            snare. Resection and retrieval were complete.                           A 4 mm polyp was found in the splenic flexure. The                            polyp was sessile. The polyp was removed with a                            cold snare. Resection and retrieval were complete.                           A few small-mouthed diverticula were found in the                            sigmoid colon.                           Internal hemorrhoids were found during                            retroflexion. The hemorrhoids were small. Complications:            No immediate complications. Estimated Blood Loss:     Estimated blood loss was minimal. Impression:               - One 5 mm polyp in the ascending colon, removed                            with a cold snare. Resected and retrieved.                           - One 6 mm polyp at the hepatic flexure, removed                             with a cold snare. Resected and retrieved.                           - One 4 mm polyp at the splenic flexure, removed                            with a cold snare. Resected and retrieved.                           - Diverticulosis in the sigmoid colon.                           - Small internal hemorrhoid. Recommendation:           - Patient has a contact number available for  emergencies. The signs and symptoms of potential                            delayed complications were discussed with the                            patient. Return to normal activities tomorrow.                            Written discharge instructions were provided to the                            patient.                           - Resume previous diet.                           - Continue present medications.                           - Await pathology results.                           - Repeat colonoscopy is recommended. The                            colonoscopy date will be determined after pathology                            results from today's exam become available for                            review. Jerene Bears, MD 03/13/2017 8:59:58 AM This report has been signed electronically.

## 2017-03-13 NOTE — Progress Notes (Signed)
Pt's states no medical or surgical changes since previsit or office visit. maw 

## 2017-03-14 ENCOUNTER — Telehealth: Payer: Self-pay

## 2017-03-14 NOTE — Telephone Encounter (Signed)
Left message

## 2017-03-16 ENCOUNTER — Encounter: Payer: Self-pay | Admitting: Internal Medicine

## 2017-04-28 ENCOUNTER — Ambulatory Visit (INDEPENDENT_AMBULATORY_CARE_PROVIDER_SITE_OTHER): Payer: BLUE CROSS/BLUE SHIELD | Admitting: Internal Medicine

## 2017-04-28 ENCOUNTER — Encounter: Payer: Self-pay | Admitting: Internal Medicine

## 2017-04-28 VITALS — BP 138/90 | HR 103 | Temp 97.7°F | Ht 68.0 in | Wt 179.0 lb

## 2017-04-28 DIAGNOSIS — Z114 Encounter for screening for human immunodeficiency virus [HIV]: Secondary | ICD-10-CM

## 2017-04-28 DIAGNOSIS — E119 Type 2 diabetes mellitus without complications: Secondary | ICD-10-CM | POA: Diagnosis not present

## 2017-04-28 DIAGNOSIS — Z0001 Encounter for general adult medical examination with abnormal findings: Secondary | ICD-10-CM | POA: Diagnosis not present

## 2017-04-28 DIAGNOSIS — E538 Deficiency of other specified B group vitamins: Secondary | ICD-10-CM

## 2017-04-28 DIAGNOSIS — Z23 Encounter for immunization: Secondary | ICD-10-CM

## 2017-04-28 DIAGNOSIS — J301 Allergic rhinitis due to pollen: Secondary | ICD-10-CM | POA: Diagnosis not present

## 2017-04-28 DIAGNOSIS — R5383 Other fatigue: Secondary | ICD-10-CM | POA: Diagnosis not present

## 2017-04-28 DIAGNOSIS — L659 Nonscarring hair loss, unspecified: Secondary | ICD-10-CM | POA: Diagnosis not present

## 2017-04-28 DIAGNOSIS — E559 Vitamin D deficiency, unspecified: Secondary | ICD-10-CM | POA: Diagnosis not present

## 2017-04-28 MED ORDER — ZOSTER VAC RECOMB ADJUVANTED 50 MCG/0.5ML IM SUSR: 0.5000 mL | Freq: Once | INTRAMUSCULAR | 1 refills | Status: AC

## 2017-04-28 MED ORDER — AZELASTINE-FLUTICASONE 137-50 MCG/ACT NA SUSP: NASAL | 5 refills | Status: DC

## 2017-04-28 NOTE — Assessment & Plan Note (Signed)
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

## 2017-04-28 NOTE — Assessment & Plan Note (Signed)
Pt should avoid allegra D due to elevated BP, ok for allegra and add Dymista asd,  to f/u any worsening symptoms or concerns

## 2017-04-28 NOTE — Patient Instructions (Addendum)
Please take all new medication as prescribed - the dymista  Please take all new medication as recommended - OTC Allegra (not the Allegra D)  You should also consider OTC Rogaine for Men  You will be contacted regarding the referral for: Dermatology  Your Shingles shot was sent to both [facility] outpt pharmacy, and [facility]  Please continue all other medications as before, and refills have been done if requested.  Please have the pharmacy call with any other refills you may need.  Please continue your efforts at being more active, low cholesterol diet, and weight control.  You are otherwise up to date with prevention measures today.  Please keep your appointments with your specialists as you may have planned  Please go to the LAB in the Basement (turn left off the elevator) for the tests to be done today  You will be contacted by phone if any changes need to be made immediately.  Otherwise, you will receive a letter about your results with an explanation, but please check with MyChart first.  Please remember to sign up for MyChart if you have not done so, as this will be important to you in the future with finding out test results, communicating by private email, and scheduling acute appointments online when needed.  Please return in 6 months, or sooner if needed, with Lab testing done 3-5 days before

## 2017-04-28 NOTE — Assessment & Plan Note (Signed)
Etiology unclear, Exam otherwise benign, to check labs as documented, follow with expectant management  

## 2017-04-28 NOTE — Assessment & Plan Note (Addendum)
?   Control, stable overall by history and exam, recent data reviewed with pt, and pt to continue medical treatment as before,  to f/u any worsening symptoms or concerns, for f/u a1c with labs  In addition to the time spent performing CPE, I spent an additional 25 minutes face to face,in which greater than 50% of this time was spent in counseling and coordination of care for patient's acute illness as documented, including the differential dx, tx, further evaluation and other management of DM, fatigue, hair loss and allergies

## 2017-04-28 NOTE — Assessment & Plan Note (Signed)

## 2017-04-28 NOTE — Progress Notes (Signed)
Subjective:    Patient ID: Deanna Hunter, female    DOB: 1960-07-03, 56 y.o.   MRN: [id number]  HPI  Here for wellness and f/u;  Overall doing ok;  Pt denies Chest pain, worsening SOB, DOE, wheezing, orthopnea, PND, worsening LE edema, palpitations, dizziness or syncope.  Pt denies neurological change such as new headache, facial or extremity weakness. . Pt states overall good compliance with treatment and medications, good tolerability, and has been trying to follow appropriate diet.  Pt denies worsening depressive symptoms, suicidal ideation or panic. No fever, night sweats, wt loss, loss of appetite, or other constitutional symptoms.  Pt states good ability with ADL's, has low fall risk, home safety reviewed and adequate, no other significant changes in hearing or vision, and only occasionally active with exercise, but just joined a gym.   Wt Readings from Last 3 Encounters:  04/28/17 179 lb (81.2 kg)  03/13/17 181 lb (82.1 kg)  02/27/17 181 lb 12.8 oz (82.5 kg)   Pt denies polydipsia, polyuria, or low sugar symptoms such as weakness or confusion improved with po intake.  Pt states overall good compliance with meds.  CBG's have been in the 160's recently.  Does have ongoing mildly worsening hair loss in last year, has not been tx.  Does c/o ongoing fatigue, but denies signficant daytime hypersomnolence.  Does have several wks ongoing nasal allergy symptoms with clearish congestion, itch and sneezing, without fever, pain, ST, cough, swelling or wheezing, but BP tends to go higher with allegra D.   Past Medical History:  Diagnosis Date  . Allergy   . 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 Diabetes mellitus without complication [facility])    Past Surgical History:  Procedure Laterality Date  . NO PAST SURGERIES    . WISDOM TOOTH EXTRACTION      reports that  has never smoked. she has never used smokeless tobacco. She reports that she does not drink alcohol or use drugs. family history  includes Arthritis in her mother; Colon polyps in her mother and sister; Heart disease in her mother; Hyperlipidemia in her mother; Hypertension in her father and mother; Stroke in her father. No Known Allergies Current Outpatient Medications on File Prior to Visit  Medication Sig Dispense Refill  . aspirin 81 MG chewable tablet Chew 81 mg by mouth as needed.    . metFORMIN (GLUCOPHAGE-XR) 500 MG 24 hr tablet Take 2 tablets (1,000 mg total) by mouth 2 (two) times daily. Must keep Decappt w/new provider for future refills 120 tablet 1  . Multiple Vitamins-Minerals (ALIVE ONCE DAILY WOMENS 50+) TABS Take by mouth.     No current facility-administered medications on file prior to visit.    Review of Systems Constitutional: Negative for other unusual diaphoresis, sweats, appetite or weight changes HENT: Negative for other worsening hearing loss, ear pain, facial swelling, mouth sores or neck stiffness.   Eyes: Negative for other worsening pain, redness or other visual disturbance.  Respiratory: Negative for other stridor or swelling Cardiovascular: Negative for other palpitations or other chest pain  Gastrointestinal: Negative for worsening diarrhea or loose stools, blood in stool, distention or other pain Genitourinary: Negative for hematuria, flank pain or other change in urine volume.  Musculoskeletal: Negative for myalgias or other joint swelling.  Skin: Negative for other color change, or other wound or worsening drainage.  Neurological: Negative for other syncope or numbness. Hematological: Negative for other adenopathy or swelling Psychiatric/Behavioral: Negative for hallucinations, other worsening  agitation, SI, self-injury, or new decreased concentration All other system neg per pt    Objective:   Physical Exam BP 138/90   Pulse (!) 103   Temp 97.7 F (36.5 C) (Oral)   Ht 5\' 8"  (1.727 m)   Wt 179 lb (81.2 kg)   SpO2 98%   BMI 27.22 kg/m  VS noted,  Constitutional: Pt is  oriented to person, place, and time. Appears well-developed and well-nourished, in no significant distress and comfortable Head: Normocephalic and atraumatic  Eyes: Conjunctivae and EOM are normal. Pupils are equal, round, and reactive to light Bilat tm's with mild erythema.  Max sinus areas non tender.  Pharynx with mild erythema, no exudate Right Ear: External ear normal without discharge Left Ear: External ear normal without discharge Nose: Nose without discharge or deformity Mouth/Throat: Oropharynx is without other ulcerations and moist  Neck: Normal range of motion. Neck supple. No JVD present. No tracheal deviation present or significant neck LA or mass Cardiovascular: Normal rate, regular rhythm, normal heart sounds and intact distal pulses.   Pulmonary/Chest: WOB normal and breath sounds without rales or wheezing  Abdominal: Soft. Bowel sounds are normal. NT. No HSM  Musculoskeletal: Normal range of motion. Exhibits no edema Lymphadenopathy: Has no other cervical adenopathy.  Neurological: Pt is alert and oriented to person, place, and time. Pt has normal reflexes. No cranial nerve deficit. Motor grossly intact, Gait intact Skin: Skin is warm and dry. No rash noted or new ulcerations but has possible female pattern baldness with frontal hairline recession and side thinning bilat Psychiatric:  Has normal mood and affect. Behavior is normal without agitation No other exam findings    Assessment & Plan:

## 2017-04-28 NOTE — Assessment & Plan Note (Signed)
Also for vit d lab f/u 

## 2017-05-02 MED FILL — SHINGRIX VIAL KIT: 50 | 1 days supply | Qty: 1 | Fill #0

## 2017-05-04 ENCOUNTER — Telehealth: Payer: Self-pay | Admitting: Internal Medicine

## 2017-05-04 MED ORDER — AZELASTINE HCL 0.1 % NA SOLN: 2.0000 | Freq: Two times a day (BID) | NASAL | 12 refills | Status: DC

## 2017-05-04 NOTE — Telephone Encounter (Signed)
dymista denied by insurance  Ok to change to Astelin nasal spray as well - done erx

## 2017-05-11 ENCOUNTER — Other Ambulatory Visit (INDEPENDENT_AMBULATORY_CARE_PROVIDER_SITE_OTHER): Payer: BLUE CROSS/BLUE SHIELD

## 2017-05-11 DIAGNOSIS — Z114 Encounter for screening for human immunodeficiency virus [HIV]: Secondary | ICD-10-CM

## 2017-05-11 DIAGNOSIS — E119 Type 2 diabetes mellitus without complications: Secondary | ICD-10-CM

## 2017-05-11 DIAGNOSIS — R5383 Other fatigue: Secondary | ICD-10-CM | POA: Diagnosis not present

## 2017-05-11 DIAGNOSIS — E538 Deficiency of other specified B group vitamins: Secondary | ICD-10-CM

## 2017-05-11 DIAGNOSIS — E559 Vitamin D deficiency, unspecified: Secondary | ICD-10-CM

## 2017-05-11 LAB — BASIC METABOLIC PANEL
BUN: 16 mg/dL (ref 6–23)
CO2: 28 mEq/L (ref 19–32)
CREATININE: 0.69 mg/dL (ref 0.40–1.20)
Calcium: 9.8 mg/dL (ref 8.4–10.5)
Chloride: 102 mEq/L (ref 96–112)
GFR: 94.08 mL/min (ref >60.00)
Glucose, Bld: 119 mg/dL — ABNORMAL HIGH (ref 70–99)
POTASSIUM: 4.2 meq/L (ref 3.5–5.1)
Sodium: 139 mEq/L (ref 135–145)

## 2017-05-11 LAB — HEPATIC FUNCTION PANEL
ALBUMIN: 4.6 g/dL (ref 3.5–5.2)
ALK PHOS: 87 U/L (ref 39–117)
ALT: 22 U/L (ref 0–35)
AST: 16 U/L (ref 0–37)
BILIRUBIN DIRECT: 0.1 mg/dL (ref 0.0–0.3)
TOTAL PROTEIN: 7.3 g/dL (ref 6.0–8.3)
Total Bilirubin: 0.4 mg/dL (ref 0.2–1.2)

## 2017-05-11 LAB — URINALYSIS, ROUTINE W REFLEX MICROSCOPIC
BILIRUBIN URINE: NEGATIVE
Hgb urine dipstick: NEGATIVE
Leukocytes, UA: NEGATIVE
Nitrite: NEGATIVE
PH: 5.5 (ref 5.0–8.0)
RBC / HPF: NONE SEEN (ref 0–?)
TOTAL PROTEIN, URINE-UPE24: NEGATIVE
Urine Glucose: NEGATIVE
Urobilinogen, UA: 0.2 (ref 0.0–1.0)

## 2017-05-11 LAB — LIPID PANEL
CHOLESTEROL: 193 mg/dL (ref 0–200)
HDL: 46.7 mg/dL (ref >39.00)
LDL Cholesterol: 107 mg/dL — ABNORMAL HIGH (ref 0–99)
NonHDL: 146.48
Total CHOL/HDL Ratio: 4
Triglycerides: 198 mg/dL — ABNORMAL HIGH (ref 0.0–149.0)
VLDL: 39.6 mg/dL (ref 0.0–40.0)

## 2017-05-11 LAB — CBC WITH DIFFERENTIAL/PLATELET
BASOS PCT: 0.7 % (ref 0.0–3.0)
Basophils Absolute: 0 10*3/uL (ref 0.0–0.1)
EOS ABS: 0.1 10*3/uL (ref 0.0–0.7)
EOS PCT: 1.7 % (ref 0.0–5.0)
HEMATOCRIT: 41.8 % (ref 36.0–46.0)
HEMOGLOBIN: 14.3 g/dL (ref 12.0–15.0)
LYMPHS PCT: 23 % (ref 12.0–46.0)
Lymphs Abs: 1.4 10*3/uL (ref 0.7–4.0)
MCHC: 34.2 g/dL (ref 30.0–36.0)
MCV: 85 fl (ref 78.0–100.0)
MONOS PCT: 7.4 % (ref 3.0–12.0)
Monocytes Absolute: 0.4 10*3/uL (ref 0.1–1.0)
Neutro Abs: 4 10*3/uL (ref 1.4–7.7)
Neutrophils Relative %: 67.2 % (ref 43.0–77.0)
Platelets: 281 10*3/uL (ref 150.0–400.0)
RBC: 4.92 Mil/uL (ref 3.87–5.11)
RDW: 12.7 % (ref 11.5–15.5)
WBC: 6 10*3/uL (ref 4.0–10.5)

## 2017-05-11 LAB — MICROALBUMIN / CREATININE URINE RATIO
Creatinine,U: 188.7 mg/dL
MICROALB UR: 1.8 mg/dL (ref 0.0–1.9)
MICROALB/CREAT RATIO: 0.9 mg/g (ref 0.0–30.0)

## 2017-05-11 LAB — HEMOGLOBIN A1C: HEMOGLOBIN A1C: 7.7 % — AB (ref 4.6–6.5)

## 2017-05-12 ENCOUNTER — Other Ambulatory Visit: Payer: Self-pay | Admitting: Internal Medicine

## 2017-05-12 ENCOUNTER — Encounter: Payer: Self-pay | Admitting: Internal Medicine

## 2017-05-12 ENCOUNTER — Telehealth: Payer: Self-pay

## 2017-05-12 DIAGNOSIS — E785 Hyperlipidemia, unspecified: Secondary | ICD-10-CM | POA: Insufficient documentation

## 2017-05-12 LAB — VITAMIN B12: VITAMIN B 12: 248 pg/mL (ref 211–911)

## 2017-05-12 LAB — HIV ANTIBODY (ROUTINE TESTING W REFLEX): HIV: NONREACTIVE

## 2017-05-12 LAB — TSH: TSH: 1.71 u[IU]/mL (ref 0.35–4.50)

## 2017-05-12 LAB — VITAMIN D 25 HYDROXY (VIT D DEFICIENCY, FRACTURES): VITD: 34.73 ng/mL (ref 30.00–100.00)

## 2017-05-12 LAB — T4, FREE: Free T4: 0.86 ng/dL (ref 0.60–1.60)

## 2017-05-12 MED ORDER — ROSUVASTATIN CALCIUM 10 MG PO TABS: 10.0000 mg | ORAL_TABLET | Freq: Every day | ORAL | 3 refills | Status: DC

## 2017-05-12 MED ORDER — METFORMIN HCL ER 500 MG PO TB24: 1000.0000 mg | ORAL_TABLET | Freq: Every day | ORAL | 3 refills | Status: DC

## 2017-05-12 NOTE — Telephone Encounter (Signed)
Called pt, LVM.   CRM created.  

## 2017-07-07 MED FILL — SHINGRIX 50 MCG SUS: 50 | 1 days supply | Qty: 1 | Fill #1

## 2017-07-14 ENCOUNTER — Other Ambulatory Visit: Payer: Self-pay | Admitting: Obstetrics and Gynecology

## 2017-07-14 DIAGNOSIS — Z1231 Encounter for screening mammogram for malignant neoplasm of breast: Secondary | ICD-10-CM

## 2017-07-17 DIAGNOSIS — B373 Candidiasis of vulva and vagina: Secondary | ICD-10-CM | POA: Diagnosis not present

## 2017-08-29 ENCOUNTER — Ambulatory Visit
Admission: RE | Admit: 2017-08-29 | Discharge: 2017-08-29 | Disposition: A | Discharge status: Home / Self Care | Payer: BLUE CROSS/BLUE SHIELD | Source: Ambulatory Visit | Attending: Obstetrics and Gynecology | Admitting: Obstetrics and Gynecology

## 2017-08-29 DIAGNOSIS — Z1231 Encounter for screening mammogram for malignant neoplasm of breast: Secondary | ICD-10-CM | POA: Diagnosis not present

## 2017-11-13 DIAGNOSIS — L84 Corns and callosities: Secondary | ICD-10-CM | POA: Diagnosis not present

## 2017-11-13 DIAGNOSIS — D2272 Melanocytic nevi of left lower limb, including hip: Secondary | ICD-10-CM | POA: Diagnosis not present

## 2017-11-13 DIAGNOSIS — B078 Other viral warts: Secondary | ICD-10-CM | POA: Diagnosis not present

## 2018-01-09 DIAGNOSIS — R87810 Cervical high risk human papillomavirus (HPV) DNA test positive: Secondary | ICD-10-CM | POA: Diagnosis not present

## 2018-01-09 DIAGNOSIS — Z6825 Body mass index (BMI) 25.0-25.9, adult: Secondary | ICD-10-CM | POA: Diagnosis not present

## 2018-01-09 DIAGNOSIS — N898 Other specified noninflammatory disorders of vagina: Secondary | ICD-10-CM | POA: Diagnosis not present

## 2018-01-09 DIAGNOSIS — Z113 Encounter for screening for infections with a predominantly sexual mode of transmission: Secondary | ICD-10-CM | POA: Diagnosis not present

## 2018-01-09 DIAGNOSIS — Z1151 Encounter for screening for human papillomavirus (HPV): Secondary | ICD-10-CM | POA: Diagnosis not present

## 2018-01-09 DIAGNOSIS — Z01419 Encounter for gynecological examination (general) (routine) without abnormal findings: Secondary | ICD-10-CM | POA: Diagnosis not present

## 2018-01-12 DIAGNOSIS — Z79899 Other long term (current) drug therapy: Secondary | ICD-10-CM | POA: Diagnosis not present

## 2018-06-13 ENCOUNTER — Telehealth: Payer: Self-pay

## 2018-06-13 NOTE — Telephone Encounter (Signed)
Left the pt a message that I was returning her call to schedule her an appt.

## 2018-06-17 ENCOUNTER — Other Ambulatory Visit: Payer: Self-pay | Admitting: Internal Medicine

## 2018-07-18 DIAGNOSIS — J019 Acute sinusitis, unspecified: Secondary | ICD-10-CM | POA: Diagnosis not present

## 2018-12-20 ENCOUNTER — Other Ambulatory Visit: Payer: Self-pay | Admitting: Internal Medicine

## 2018-12-24 IMAGING — CT CT ANGIO CHEST
2 of 6 series · 19 of 36 positions shown · IV contrast (ISOVUE 370)
Comparison: None.

CLINICAL DATA: Pt c/o of extreme fatigue x's 2 days. Pt reports she
began to have a cough and fever last night. Took an antipyretic at
that time. Since then has had generalized weakness. Nonsmoker.

EXAM:
CT ANGIOGRAPHY CHEST WITH CONTRAST
TECHNIQUE: Multidetector CT imaging of the chest was performed using the
standard protocol during bolus administration of intravenous
contrast. Multiplanar CT image reconstructions and MIPs were
obtained to evaluate the vascular anatomy.
CONTRAST:  100 mL Isovue

[Series 6: thins for pacs · axial · 0.61mm/px · z∈[-396,-146]mm · 18 of 278 slices shown]
[im 14/278  lung]
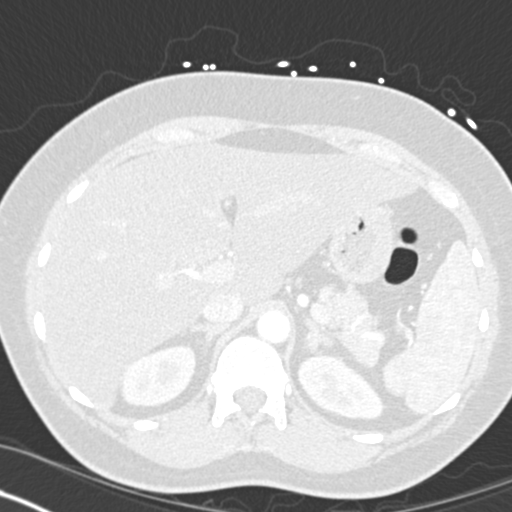
[im 28/278  mediastinal]
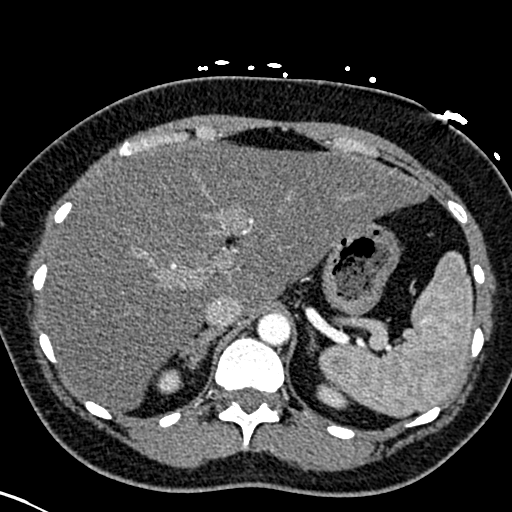
[im 42/278  lung]
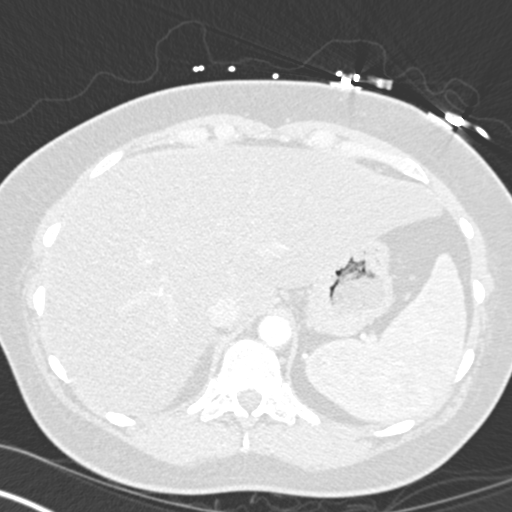
[im 56/278  mediastinal]
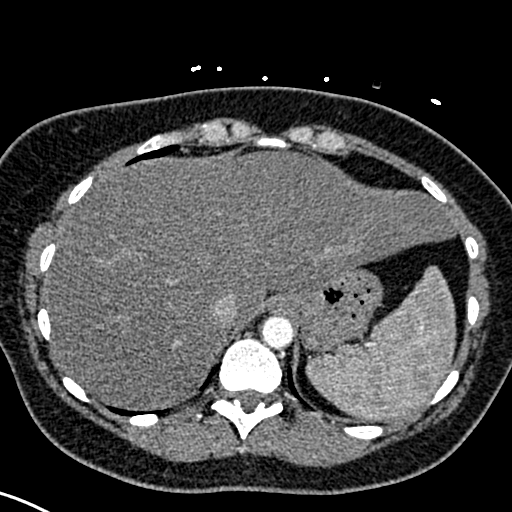
[im 70/278  lung]
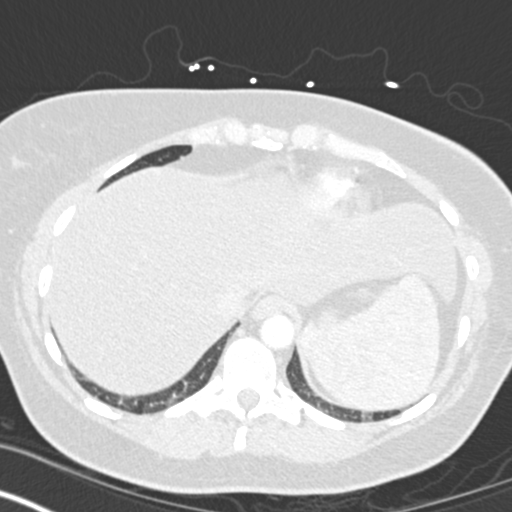
[im 84/278  mediastinal]
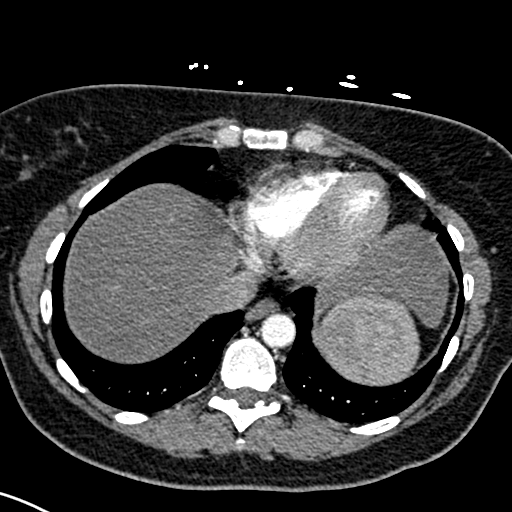
[im 97/278  lung]
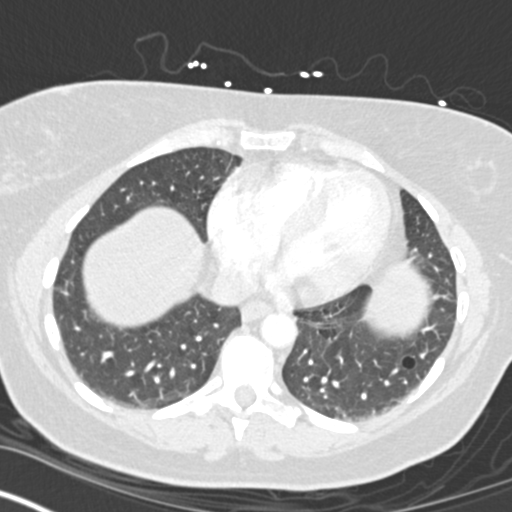
[im 111/278  mediastinal]
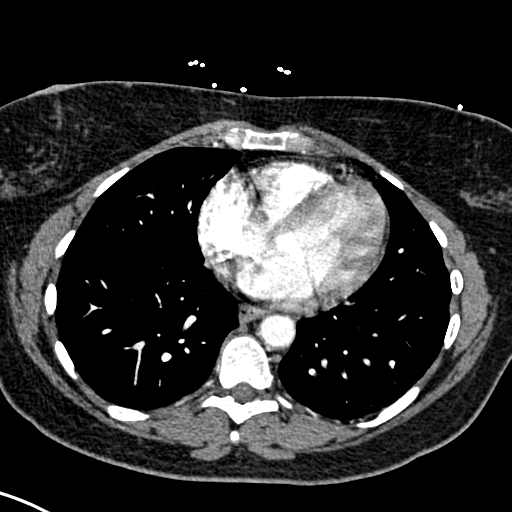
[im 125/278  lung]
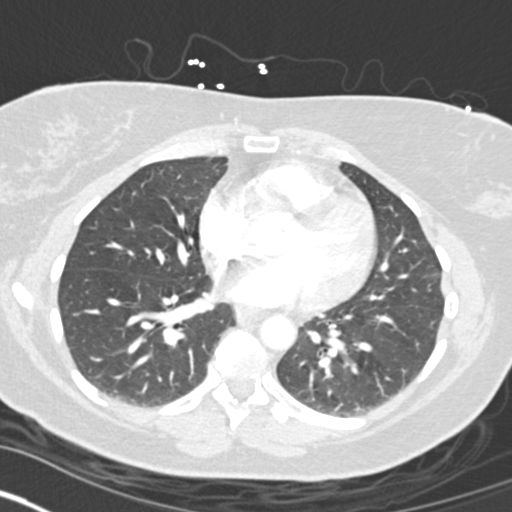
[im 153/278  mediastinal]
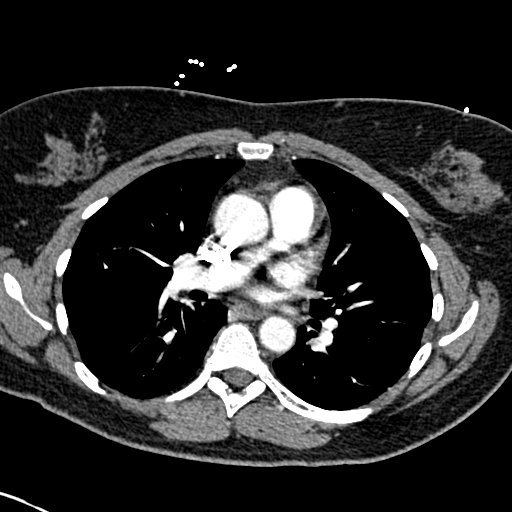
[im 167/278  lung]
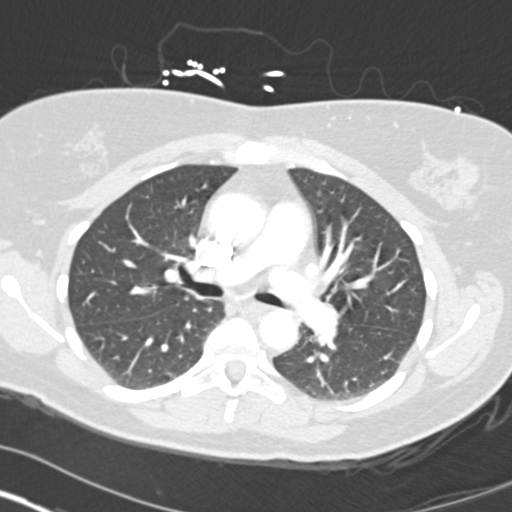
[im 181/278  mediastinal]
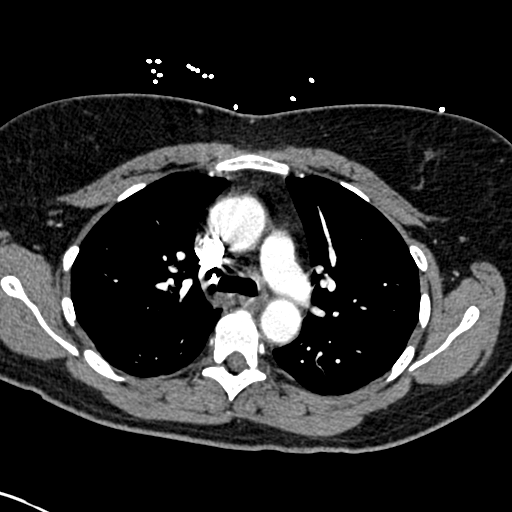
[im 194/278  lung]
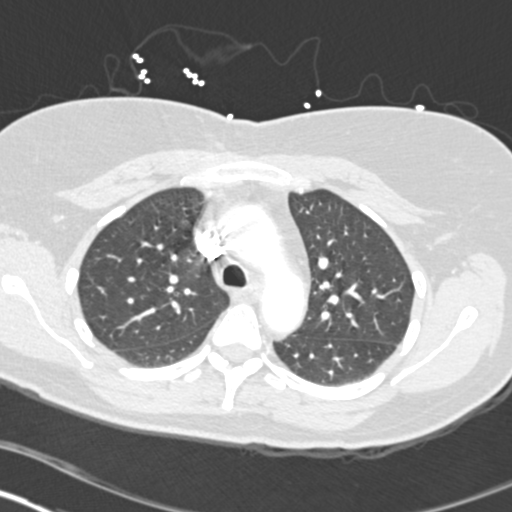
[im 208/278  mediastinal]
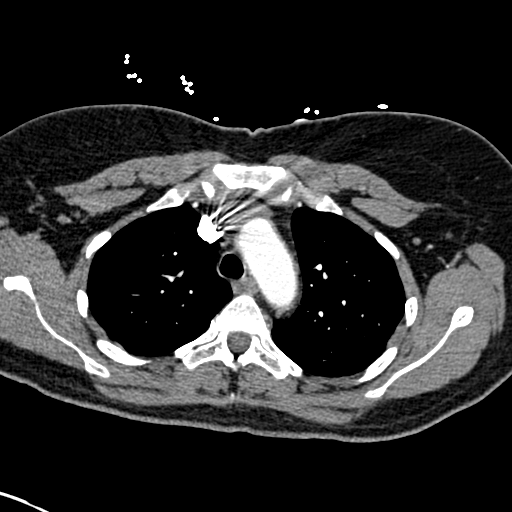
[im 222/278  lung]
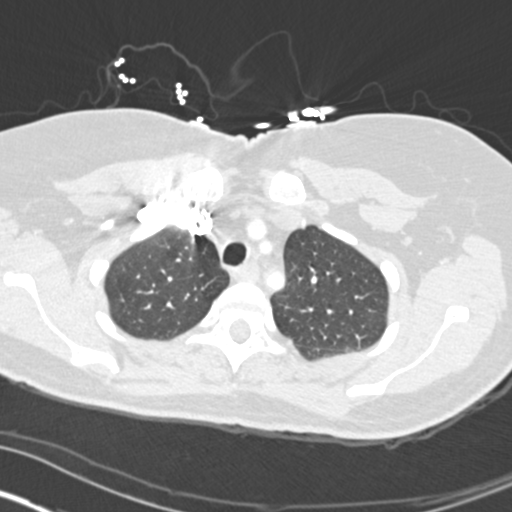
[im 236/278  mediastinal]
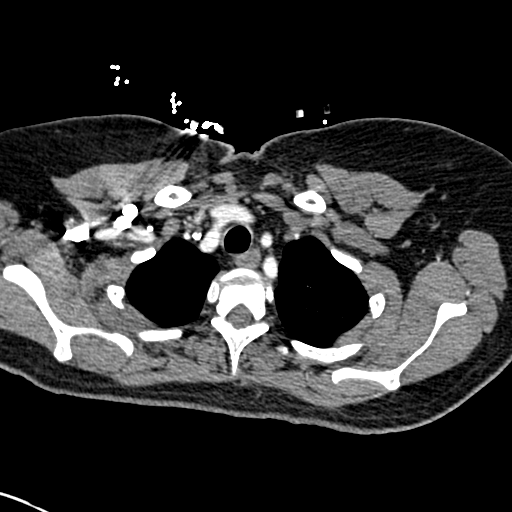
[im 250/278  lung]
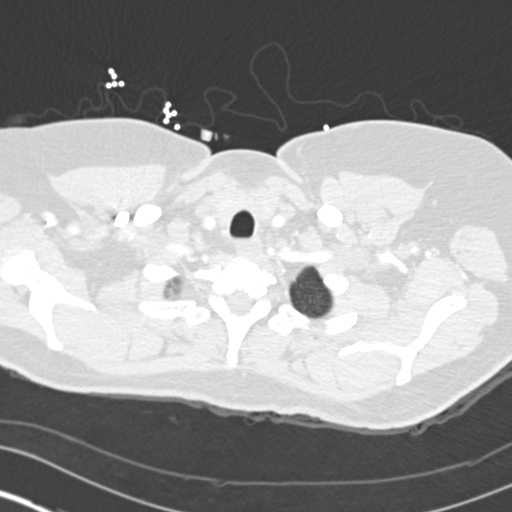
[im 264/278  mediastinal]
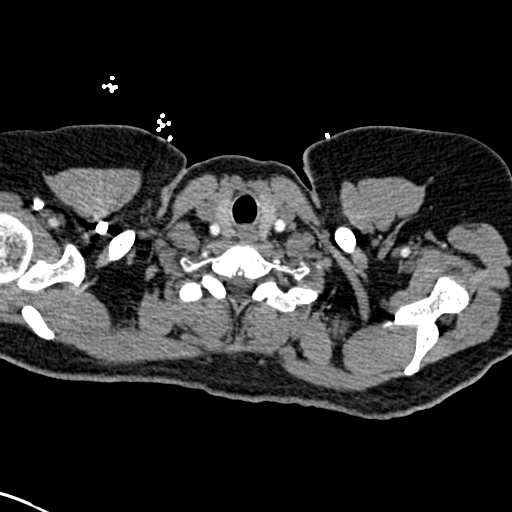

[Series 8: coronal mpr · coronal · 0.54mm/px · 1 of 121 slices shown]
[im 61/121  mediastinal]
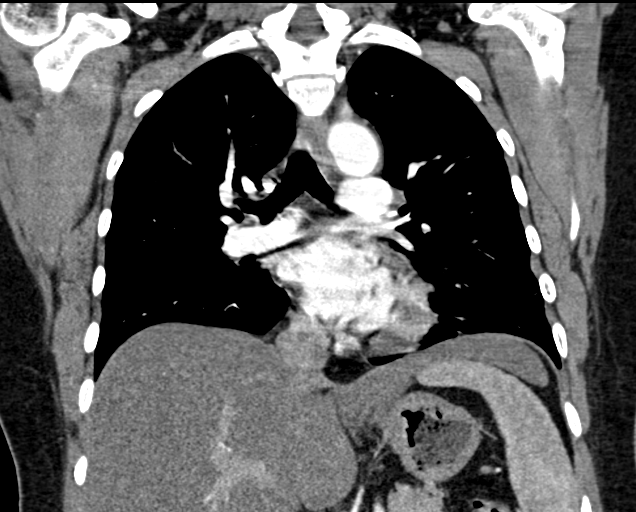

[19 of 36 positions shown; findings below may reference images not displayed]

FINDINGS: Cardiovascular: No filling defects within the pulmonary arteries to
suggest acute pulmonary embolism. No acute findings of the aorta or
great vessels. No pericardial fluid.

Mediastinum/Nodes: No axillary supraclavicular adenopathy. No
mediastinal hilar adenopathy. Esophagus normal.

Lungs/Pleura: No pulmonary infarction. No infiltrate or pleural
fluid. No pneumothorax. Airways normal.

Upper Abdomen: Low-attenuation of the liver parenchyma. Limited view
of the kidneys, pancreas are unremarkable. Normal adrenal glands.

Musculoskeletal: No aggressive osseous lesion.

Review of the MIP images confirms the above findings.
IMPRESSION: 1. No acute pulmonary embolism.
2. No acute pulmonary parenchymal findings.
3. Probable hepatic steatosis.

## 2018-12-24 IMAGING — DX DG CHEST 1V PORT
1 series · 1 of 1 positions shown · non-contrast
Comparison: None.

CLINICAL DATA: Shortness of breath.

EXAM:
PORTABLE CHEST 1 VIEW

[chest ap]
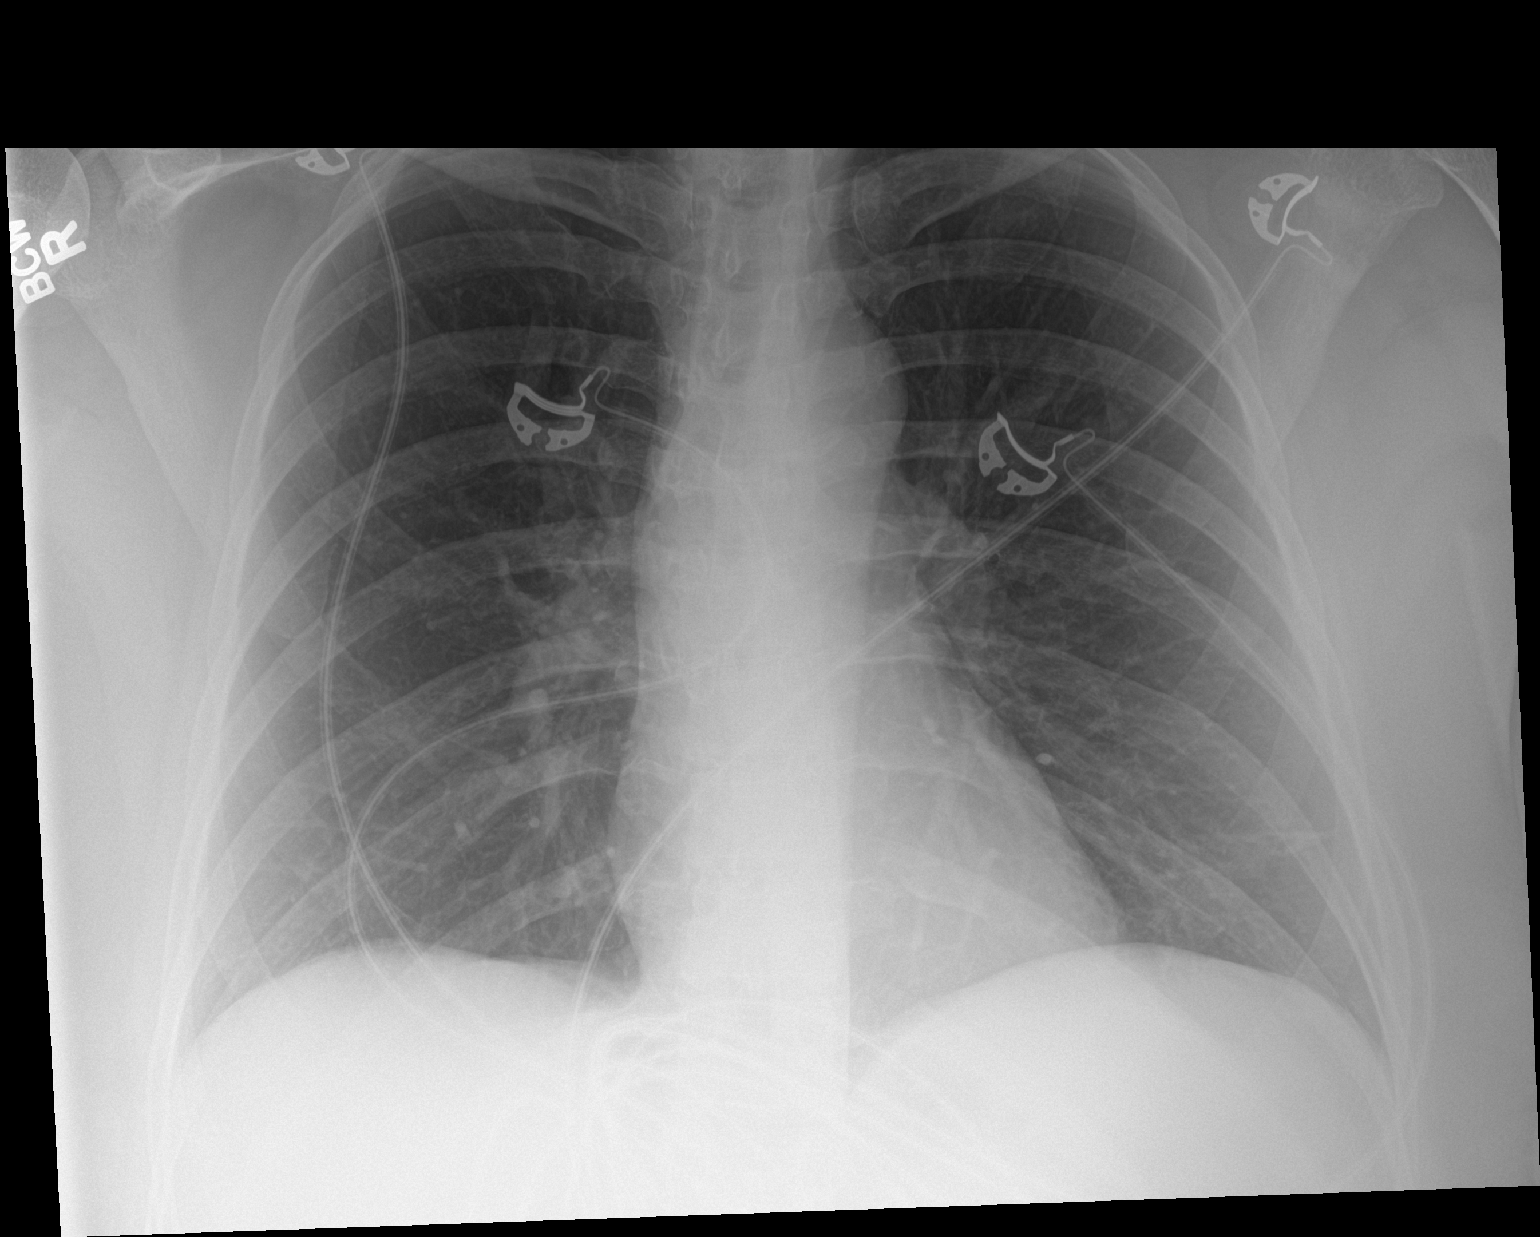

[1 of 1 positions shown; findings below may reference images not displayed]

FINDINGS: The heart size and mediastinal contours are within normal limits.
Both lungs are clear. The visualized skeletal structures are
unremarkable.
IMPRESSION: Normal chest.

## 2019-04-26 ENCOUNTER — Other Ambulatory Visit: Payer: Self-pay | Admitting: Internal Medicine

## 2019-04-26 MED ORDER — METFORMIN HCL ER 500 MG PO TB24: ORAL_TABLET | ORAL | 1 refills | Status: DC

## 2019-04-26 MED ORDER — AZELASTINE HCL 0.1 % NA SOLN: 2.0000 | Freq: Two times a day (BID) | NASAL | 12 refills | Status: DC

## 2019-04-26 NOTE — Telephone Encounter (Signed)
Medication Refill - Medication: metFORMIN (GLUCOPHAGE-XR) 500 MG 24 hr tablet  azelastine (ASTELIN) 0.1 % nasal spray   Has the patient contacted their pharmacy? Yes.   (Agent: If no, request that the patient contact the pharmacy for the refill.) (Agent: If yes, when and what did the pharmacy advise?)  Preferred Pharmacy (with phone number or street name):  [id number] - [place], [place]. [address]  [place] [place] [address]  Phone: [phone] Fax: [phone]     Agent: Please be advised that RX refills may take up to 3 business days. We ask that you follow-up with your pharmacy.

## 2019-04-26 NOTE — Telephone Encounter (Signed)
Requesting 90 day supply for Metformin please advise  [phone]

## 2019-04-26 NOTE — Telephone Encounter (Signed)
Requested medication (s) are due for refill today: yes  Requested medication (s) are on the active medication list: yes  Last refill: 05/04/2017  Future visit scheduled: yes  Notes to clinic:  review for refill Overdue for office visit    Requested Prescriptions  Pending Prescriptions Disp Refills   azelastine (ASTELIN) 0.1 % nasal spray 30 mL 12    Sig: Place 2 sprays into both nostrils 2 (two) times daily. Use in each nostril as directed      Ear, Nose, and Throat: Nasal Preparations - Antiallergy Failed - 04/26/2019  2:43 PM      Failed - Valid encounter within last 12 months    Recent Outpatient Visits           1 year ago Encounter for well adult exam with abnormal findings   [facility] -Junie Bame, Hunt Oris, MD   2 years ago Type 2 diabetes mellitus without complication, without long-term current use of insulin ([place])   [place], FNP   4 years ago Type 2 diabetes mellitus without complication   [place], FNP       Future Appointments             In 5 months Jenny Reichmann, Hunt Oris, MD [place]              metFORMIN (GLUCOPHAGE-XR) 500 MG 24 hr tablet 180 tablet 1    Sig: TAKE 2 TABLETS BY MOUTH EVERY DAY WITH BREAKFAST *MUST KEEP DR APPT WITH NEW PROVIDER *[facility]*      Endocrinology:  Diabetes - Biguanides Failed - 04/26/2019  2:43 PM      Failed - Cr in normal range and within 360 days    Creatinine, Ser  Date Value Ref Range Status  05/11/2017 0.69 0.40 - 1.20 mg/dL Final   Creatinine,U  Date Value Ref Range Status  05/11/2017 188.7 mg/dL Final          Failed - HBA1C is between 0 and 7.9 and within 180 days    Hgb A1c MFr Bld  Date Value Ref Range Status  05/11/2017 7.7 (H) 4.6 - 6.5 % Final    Comment:    Glycemic Control Guidelines for People with Diabetes:Non Diabetic:  <6%Goal of Therapy: <7%Additional Action  Suggested:  >8%           Failed - eGFR in normal range and within 360 days    GFR calc Af Amer  Date Value Ref Range Status  11/06/2016 >60 >60 mL/min Final    Comment:    (NOTE) The eGFR has been calculated using the CKD EPI equation. This calculation has not been validated in all clinical situations. eGFR's persistently <60 mL/min signify possible Chronic Kidney Disease.    GFR calc non Af Amer  Date Value Ref Range Status  11/06/2016 >60 >60 mL/min Final   GFR  Date Value Ref Range Status  05/11/2017 94.08 >60.00 mL/min Final          Failed - Valid encounter within last 6 months    Recent Outpatient Visits           1 year ago Encounter for well adult exam with abnormal findings   [facility] -Georges Mouse, MD   2 years ago Type 2 diabetes mellitus without complication, without long-term current use of insulin ([place])   [facility] -Aaron Mose, FNP   4 years ago Type 2 diabetes mellitus without complication   [place], Gregory D, FNP       Future Appointments             In 5 months Jenny Reichmann, Hunt Oris, MD [place]

## 2019-05-29 DIAGNOSIS — Z124 Encounter for screening for malignant neoplasm of cervix: Secondary | ICD-10-CM | POA: Diagnosis not present

## 2019-05-29 DIAGNOSIS — Z6828 Body mass index (BMI) 28.0-28.9, adult: Secondary | ICD-10-CM | POA: Diagnosis not present

## 2019-05-29 DIAGNOSIS — Z01419 Encounter for gynecological examination (general) (routine) without abnormal findings: Secondary | ICD-10-CM | POA: Diagnosis not present

## 2019-08-08 ENCOUNTER — Other Ambulatory Visit: Payer: Self-pay

## 2019-08-08 ENCOUNTER — Encounter: Payer: Self-pay | Admitting: Physician Assistant

## 2019-08-08 ENCOUNTER — Ambulatory Visit (INDEPENDENT_AMBULATORY_CARE_PROVIDER_SITE_OTHER): Payer: BC Managed Care – PPO | Admitting: Physician Assistant

## 2019-08-08 DIAGNOSIS — L659 Nonscarring hair loss, unspecified: Secondary | ICD-10-CM | POA: Diagnosis not present

## 2019-08-08 NOTE — Progress Notes (Signed)
   New Patient Visit  Subjective  Deanna Hunter is a 59 y.o. female who presents for the following: Hairloss (She states that she has been losing hair for years now.  It has gotten worse over the past year to the point where she can see her scalp.  She has started using Nizoral AD, taking Biotin and multivitamin.  Admits to trying Rogain in the past but was not consistent with using it. She has not seen her PCP in 2 years.). She has had hair loss for years but this past few months it has gotten much worse. She feels like it is tops and sides. She finds hair on her counters. She also noticed scaling but hadn't had any itch. Started Biotin mid February, Nizoral ad shampoo and that helped the scale also in Feb. She only used Minoxidil a short period of time.       Objective  Well appearing patient in no apparent distress; mood and affect are within normal limits.  A focused examination was performed including scalp and hair. Relevant physical exam findings are noted in the Assessment and Plan.  Objective  Scalp: Areas of thinning hair temples and within crown.  Assessment & Plan  Alopecia Scalp  Other Related Procedures Iron Ferritin TSH T4, Free Thyroglobulin AB Vitamin D, 25-hydroxy  We discussed Biotin, Minoxidil and protein in her diet.

## 2019-08-08 NOTE — Patient Instructions (Signed)
Lab order given with directions to [facility].  Follow-up in 3 months with JCB.

## 2019-08-13 DIAGNOSIS — L659 Nonscarring hair loss, unspecified: Secondary | ICD-10-CM | POA: Diagnosis not present

## 2019-08-14 LAB — T4, FREE: Free T4: 1.1 ng/dL (ref 0.8–1.8)

## 2019-08-14 LAB — THYROGLOBULIN ANTIBODY: Thyroglobulin Ab: <1 IU/mL (ref <=1)

## 2019-08-14 LAB — TSH: TSH: 1.42 mIU/L (ref 0.40–4.50)

## 2019-08-14 LAB — FERRITIN: Ferritin: 131 ng/mL (ref 16–232)

## 2019-08-14 LAB — VITAMIN D 25 HYDROXY (VIT D DEFICIENCY, FRACTURES): Vit D, 25-Hydroxy: 29 ng/mL — ABNORMAL LOW (ref 30–100)

## 2019-08-14 LAB — IRON: Iron: 56 ug/dL (ref 45–160)

## 2019-08-26 ENCOUNTER — Telehealth: Payer: Self-pay

## 2019-08-26 NOTE — Telephone Encounter (Signed)
Phone call to patient with her lab results and Anderson [facility] Clark-Bruning's recommendations. Patient aware of lab results, patient couldn't remember how much biotin to take.  I asked Arlyss Gandy how much biotin a hair loss patient should take and she informed me 3.5mg -5mg .  Patient aware.

## 2019-08-26 NOTE — Telephone Encounter (Signed)
-----   Message from Arlyss Gandy, [place] sent at 08/26/2019  7:59 AM EDT ----- Only abnormal hair loss lab was Vit D which was low. She can add a vit D supplement. Her iron and Ferritin are normal.

## 2019-09-18 ENCOUNTER — Other Ambulatory Visit: Payer: Self-pay | Admitting: Obstetrics and Gynecology

## 2019-09-18 DIAGNOSIS — Z1231 Encounter for screening mammogram for malignant neoplasm of breast: Secondary | ICD-10-CM

## 2019-10-17 ENCOUNTER — Other Ambulatory Visit: Payer: Self-pay | Admitting: Internal Medicine

## 2019-10-21 ENCOUNTER — Encounter: Payer: Self-pay | Admitting: Internal Medicine

## 2019-10-21 ENCOUNTER — Other Ambulatory Visit: Payer: Self-pay

## 2019-10-21 ENCOUNTER — Ambulatory Visit (INDEPENDENT_AMBULATORY_CARE_PROVIDER_SITE_OTHER): Payer: BC Managed Care – PPO | Admitting: Internal Medicine

## 2019-10-21 VITALS — BP 140/90 | HR 86 | Temp 98.3°F | Ht 68.0 in | Wt 179.0 lb

## 2019-10-21 DIAGNOSIS — E119 Type 2 diabetes mellitus without complications: Secondary | ICD-10-CM | POA: Diagnosis not present

## 2019-10-21 DIAGNOSIS — Z0000 Encounter for general adult medical examination without abnormal findings: Secondary | ICD-10-CM | POA: Diagnosis not present

## 2019-10-21 MED ORDER — METFORMIN HCL ER 500 MG PO TB24: ORAL_TABLET | ORAL | 3 refills | Status: DC

## 2019-10-21 NOTE — Assessment & Plan Note (Signed)

## 2019-10-21 NOTE — Patient Instructions (Addendum)
Please continue all other medications as before, and refills have been done if requested.  Please have the pharmacy call with any other refills you may need.  Please continue your efforts at being more active, low cholesterol diet, and weight control.  You are otherwise up to date with prevention measures today.  Please keep your appointments with your specialists as you may have planned  Please go to the LAB at the blood drawing area for the tests to be done in 1 mo as you mentioned  You will be contacted by phone if any changes need to be made immediately.  Otherwise, you will receive a letter about your results with an explanation, but please check with MyChart first.  Please remember to sign up for MyChart if you have not done so, as this will be important to you in the future with finding out test results, communicating by private email, and scheduling acute appointments online when needed.  Please make an Appointment to return in 6 months, or sooner if needed

## 2019-10-21 NOTE — Assessment & Plan Note (Signed)
stable overall by history and exam, recent data reviewed with pt, and pt to continue medical treatment as before,  to f/u any worsening symptoms or concerns  

## 2019-10-21 NOTE — Progress Notes (Addendum)
Subjective:    Patient ID: Deanna Hunter, female    DOB: Mar 14, 1961, 59 y.o.   MRN: [id number]  HPI  Here for wellness and f/u;  Overall doing ok;  Pt denies Chest pain, worsening SOB, DOE, wheezing, orthopnea, PND, worsening LE edema, palpitations, dizziness or syncope.  Pt denies neurological change such as new headache, facial or extremity weakness.  Pt denies polydipsia, polyuria, or low sugar symptoms. Pt states overall good compliance with treatment and medications, good tolerability, and has been trying to follow appropriate diet.  Pt denies worsening depressive symptoms, suicidal ideation or panic. No fever, night sweats, wt loss, loss of appetite, or other constitutional symptoms.  Pt states good ability with ADL's, has low fall risk, home safety reviewed and adequate, no other significant changes in hearing or vision, and only occasionally active with exercise.  Pt plans to have mammogram, pap, and eye exam soon.  Wt Readings from Last 3 Encounters:  10/21/19 179 lb (81.2 kg)  04/28/17 179 lb (81.2 kg)  03/13/17 181 lb (82.1 kg)   BP Readings from Last 3 Encounters:  10/21/19 140/90  04/28/17 138/90  03/13/17 (!) 148/92   Past Medical History:  Diagnosis Date  . Allergy   . 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 Diabetes mellitus without complication [facility])    Past Surgical History:  Procedure Laterality Date  . NO PAST SURGERIES    . WISDOM TOOTH EXTRACTION      reports that she has never smoked. She has never used smokeless tobacco. She reports that she does not drink alcohol or use drugs. family history includes Arthritis in her mother; Breast cancer in her paternal aunt; Colon polyps in her mother and sister; Heart disease in her mother; Hyperlipidemia in her mother; Hypertension in her father and mother; Skin cancer in her father and mother; Stroke in her father. No Known Allergies Current Outpatient Medications on File Prior to Visit  Medication Sig Dispense Refill   . aspirin 81 MG chewable tablet Chew 81 mg by mouth as needed.    Marland Kitchen azelastine (ASTELIN) 0.1 % nasal spray Place 2 sprays into both nostrils 2 (two) times daily. Use in each nostril as directed 30 mL 12  . Biotin 300 MCG TABS Take by mouth.    . Multiple Vitamins-Minerals (ALIVE ONCE DAILY WOMENS 50+) TABS Take by mouth.     No current facility-administered medications on file prior to visit.   Review of Systems All otherwise neg per pt    Objective:   Physical Exam BP 140/90 (BP Location: Left Arm, Patient Position: Sitting, Cuff Size: Large)   Pulse 86   Temp 98.3 F (36.8 C) (Oral)   Ht 5\' 8"  (1.727 m)   Wt 179 lb (81.2 kg)   SpO2 98%   BMI 27.22 kg/m  VS noted,  Constitutional: Pt appears in NAD HENT: Head: NCAT.  Right Ear: External ear normal.  Left Ear: External ear normal.  Eyes: . Pupils are equal, round, and reactive to light. Conjunctivae and EOM are normal Nose: without d/c or deformity Neck: Neck supple. Gross normal ROM Cardiovascular: Normal rate and regular rhythm.   Pulmonary/Chest: Effort normal and breath sounds without rales or wheezing.  Abd:  Soft, NT, ND, + BS, no organomegaly Neurological: Pt is alert. At baseline orientation, motor grossly intact Skin: Skin is warm. No rashes, other new lesions, no LE edema Psychiatric: Pt behavior is normal without agitation  All otherwise neg per  pt Lab Results  Component Value Date   WBC 6.0 05/11/2017   HGB 14.3 05/11/2017   HCT 41.8 05/11/2017   PLT 281.0 05/11/2017   GLUCOSE 119 (H) 05/11/2017   CHOL 193 05/11/2017   TRIG 198.0 (H) 05/11/2017   HDL 46.70 05/11/2017   LDLCALC 107 (H) 05/11/2017   ALT 22 05/11/2017   AST 16 05/11/2017   NA 139 05/11/2017   K 4.2 05/11/2017   CL 102 05/11/2017   CREATININE 0.69 05/11/2017   BUN 16 05/11/2017   CO2 28 05/11/2017   TSH 1.42 08/13/2019   HGBA1C 7.7 (H) 05/11/2017   MICROALBUR 1.8 05/11/2017      Assessment & Plan:

## 2019-11-14 ENCOUNTER — Ambulatory Visit: Payer: BC Managed Care – PPO | Admitting: Physician Assistant

## 2019-12-11 ENCOUNTER — Ambulatory Visit (INDEPENDENT_AMBULATORY_CARE_PROVIDER_SITE_OTHER): Payer: BC Managed Care – PPO | Admitting: Physician Assistant

## 2019-12-11 ENCOUNTER — Other Ambulatory Visit: Payer: Self-pay

## 2019-12-11 ENCOUNTER — Encounter: Payer: Self-pay | Admitting: Physician Assistant

## 2019-12-11 DIAGNOSIS — L659 Nonscarring hair loss, unspecified: Secondary | ICD-10-CM

## 2019-12-11 DIAGNOSIS — L84 Corns and callosities: Secondary | ICD-10-CM

## 2019-12-11 NOTE — Progress Notes (Signed)
   Follow up Visit  Subjective  Deanna Hunter is a 59 y.o. female who presents for the following: Skin Problem (warts on feet, history of hair loss history of rogaine use). She is not consistent with her rogaine. She feels like she has been having less loss. 3 warts on feet. Present   Objective  Well appearing patient in no apparent distress; mood and affect are within normal limits.  A focused examination was performed including hair, scalp, feet. Relevant physical exam findings are noted in the Assessment and Plan.   Objective  Scalp: New growth noted on scalp in two lengths   Objective  Left Lateral Plantar Surface, Right 4th Metatarsal Plantar Area, Right Plantar Surface of Heel: Callus noted feet  Assessment & Plan  Alopecia Scalp  Can restart minoxidil if hair loss begins again  Corns (3) Right 4th Metatarsal Plantar Area; Left Lateral Plantar Surface; Right Plantar Surface of Heel  Pared down in office.

## 2020-02-20 ENCOUNTER — Other Ambulatory Visit: Payer: Self-pay

## 2020-02-20 ENCOUNTER — Ambulatory Visit
Admission: RE | Admit: 2020-02-20 | Discharge: 2020-02-20 | Disposition: A | Discharge status: Home / Self Care | Payer: BC Managed Care – PPO | Source: Ambulatory Visit | Attending: Obstetrics and Gynecology | Admitting: Obstetrics and Gynecology

## 2020-02-20 DIAGNOSIS — Z1231 Encounter for screening mammogram for malignant neoplasm of breast: Secondary | ICD-10-CM | POA: Diagnosis not present

## 2020-06-29 ENCOUNTER — Encounter: Payer: Self-pay | Admitting: Internal Medicine

## 2020-09-30 ENCOUNTER — Other Ambulatory Visit (HOSPITAL_COMMUNITY): Payer: Self-pay

## 2020-09-30 ENCOUNTER — Encounter: Payer: Self-pay | Admitting: Family Medicine

## 2020-09-30 ENCOUNTER — Telehealth (INDEPENDENT_AMBULATORY_CARE_PROVIDER_SITE_OTHER): Payer: BC Managed Care – PPO | Admitting: Family Medicine

## 2020-09-30 VITALS — Temp 99.1°F | Ht 68.0 in

## 2020-09-30 DIAGNOSIS — U071 COVID-19: Secondary | ICD-10-CM | POA: Diagnosis not present

## 2020-09-30 MED ORDER — NIRMATRELVIR/RITONAVIR (PAXLOVID)TABLET
3.0000 | ORAL_TABLET | Freq: Two times a day (BID) | ORAL | 0 refills | Status: AC
  Filled 2020-09-30: qty 30, 5d supply, fill #0

## 2020-09-30 MED ORDER — PROMETHAZINE HCL 25 MG PO TABS
25.0000 mg | ORAL_TABLET | Freq: Three times a day (TID) | ORAL | 0 refills | Status: DC | PRN
  Filled 2020-09-30: qty 20, 7d supply, fill #0

## 2020-09-30 NOTE — Progress Notes (Signed)
Established Patient Office Visit  Subjective:  Patient ID: Deanna Hunter, female    DOB: 1960/09/25  Age: 60 y.o. MRN: [id number]  CC:  Chief Complaint  Patient presents with  . Covid Positive    Cough, fever, little headache, jaw pains symptoms x 2 days. Positive covid test yesterday     HPI [name] presents for a 2-day history of fever with chills fatigue stuffy nose postnasal drip cough and myalgias.  She had 1 episode of vomiting that has subsided.  Appetite is returned.  There is been no wheezing or shortness of breath.  Taste and smell remain intact.  There is been no diarrhea.  Home test for COVID was positive yesterday.  She has a history of diabetes and hepatic steatosis.  She has no asthma history and does not smoke.  She has had 2 Moderna vaccines but not the booster.  Past Medical History:  Diagnosis Date  . Allergy   . 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 Diabetes mellitus without complication [facility])     Past Surgical History:  Procedure Laterality Date  . NO PAST SURGERIES    . WISDOM TOOTH EXTRACTION      Family History  Problem Relation Age of Onset  . Arthritis Mother   . Hyperlipidemia Mother   . Heart disease Mother   . Hypertension Mother   . Colon polyps Mother   . Skin cancer Mother   . Stroke Father   . Hypertension Father   . Skin cancer Father   . Colon polyps Sister   . Breast cancer Paternal Aunt   . Colon cancer Neg Hx   . Esophageal cancer Neg Hx   . Rectal cancer Neg Hx   . Stomach cancer Neg Hx   . Pancreatic cancer Neg Hx   . Prostate cancer Neg Hx     Social History   Socioeconomic History  . Marital status: Single    Spouse name: Not on file  . Number of children: 0  . Years of education: 55  . Highest education level: Not on file  Occupational History  . Occupation: Radiation protection practitioner  Tobacco Use  . Smoking status: Never Smoker  . Smokeless tobacco: Never Used  Vaping Use  . Vaping Use: Never used   Substance and Sexual Activity  . Alcohol use: No    Alcohol/week: 0.0 standard drinks  . Drug use: No  . Sexual activity: Yes    Birth control/protection: Post-menopausal  Other Topics Concern  . Not on file  Social History Narrative   Fun: Does not have a lot to do.    Denies any religious beliefs effecting health care.    Social Determinants of Health   Financial Resource Strain: Not on file  Food Insecurity: Not on file  Transportation Needs: Not on file  Physical Activity: Not on file  Stress: Not on file  Social Connections: Not on file  Intimate Partner Violence: Not on file    Outpatient Medications Prior to Visit  Medication Sig Dispense Refill  . azelastine (ASTELIN) 0.1 % nasal spray Place 2 sprays into both nostrils 2 (two) times daily. Use in each nostril as directed 30 mL 12  . metFORMIN (GLUCOPHAGE-XR) 500 MG 24 hr tablet TAKE 2 TABLETS BY MOUTH EVERY DAY WITH BREAKFAST 180 tablet 3  . Multiple Vitamins-Minerals (ALIVE ONCE DAILY WOMENS 50+) TABS Take by mouth.    Marland Kitchen aspirin 81 MG chewable tablet Chew 81 mg  by mouth as needed. (Patient not taking: Reported on 09/30/2020)    . Biotin 300 MCG TABS Take by mouth. (Patient not taking: Reported on 09/30/2020)     No facility-administered medications prior to visit.    No Known Allergies  ROS Review of Systems  Constitutional: Positive for chills, fatigue and fever.  HENT: Positive for congestion and postnasal drip. Negative for sore throat.   Eyes: Negative for photophobia and visual disturbance.  Respiratory: Positive for cough. Negative for chest tightness, shortness of breath and wheezing.   Gastrointestinal: Positive for vomiting. Negative for diarrhea.  Musculoskeletal: Positive for myalgias. Negative for arthralgias.  Psychiatric/Behavioral: Negative.       Objective:    Physical Exam Constitutional:      General: She is not in acute distress.    Appearance: Normal appearance. She is not  ill-appearing, toxic-appearing or diaphoretic.  HENT:     Head: Normocephalic and atraumatic.  Eyes:     General: No scleral icterus.       Right eye: No discharge.        Left eye: No discharge.     Conjunctiva/sclera: Conjunctivae normal.  Pulmonary:     Effort: Pulmonary effort is normal.  Neurological:     Mental Status: She is alert and oriented to person, place, and time.  Psychiatric:        Mood and Affect: Mood normal.        Behavior: Behavior normal.     Temp 99.1 F (37.3 C) (Oral)   Ht 5\' 8"  (1.727 m)   BMI 27.22 kg/m  Wt Readings from Last 3 Encounters:  10/21/19 179 lb (81.2 kg)  04/28/17 179 lb (81.2 kg)  03/13/17 181 lb (82.1 kg)     Health Maintenance Due  Topic Date Due  . PNEUMOCOCCAL POLYSACCHARIDE VACCINE AGE 75-64 HIGH RISK  Never done  . OPHTHALMOLOGY EXAM  Never done  . TETANUS/TDAP  Never done  . PAP SMEAR-Modifier  Never done  . HEMOGLOBIN A1C  11/09/2017  . URINE MICROALBUMIN  05/11/2018  . COVID-19 Vaccine (3 - Booster for Moderna series) 01/12/2020  . COLONOSCOPY (Pts 45-68yrs Insurance coverage will need to be confirmed)  03/13/2020    There are no preventive care reminders to display for this patient.  Lab Results  Component Value Date   TSH 1.42 08/13/2019   Lab Results  Component Value Date   WBC 6.0 05/11/2017   HGB 14.3 05/11/2017   HCT 41.8 05/11/2017   MCV 85.0 05/11/2017   PLT 281.0 05/11/2017   Lab Results  Component Value Date   NA 139 05/11/2017   K 4.2 05/11/2017   CO2 28 05/11/2017   GLUCOSE 119 (H) 05/11/2017   BUN 16 05/11/2017   CREATININE 0.69 05/11/2017   BILITOT 0.4 05/11/2017   ALKPHOS 87 05/11/2017   AST 16 05/11/2017   ALT 22 05/11/2017   PROT 7.3 05/11/2017   ALBUMIN 4.6 05/11/2017   CALCIUM 9.8 05/11/2017   ANIONGAP 9 11/06/2016   GFR 94.08 05/11/2017   Lab Results  Component Value Date   CHOL 193 05/11/2017   Lab Results  Component Value Date   HDL 46.70 05/11/2017   Lab Results   Component Value Date   LDLCALC 107 (H) 05/11/2017   Lab Results  Component Value Date   TRIG 198.0 (H) 05/11/2017   Lab Results  Component Value Date   CHOLHDL 4 05/11/2017   Lab Results  Component Value Date   HGBA1C  7.7 (H) 05/11/2017      Assessment & Plan:   Problem List Items Addressed This Visit   None   Visit Diagnoses    COVID-19    -  Primary   Relevant Medications   nirmatrelvir/ritonavir EUA (PAXLOVID) TABS   promethazine (PHENERGAN) 25 MG tablet      Meds ordered this encounter  Medications  . nirmatrelvir/ritonavir EUA (PAXLOVID) TABS    Sig: Take 3 tablets by mouth 2 (two) times daily for 5 days. Patient GFR is 94 Take nirmatrelvir (150 mg) two tablets twice daily for 5 days and ritonavir (100 mg) one tablet twice daily for 5 days.    Dispense:  30 tablet    Refill:  0  . promethazine (PHENERGAN) 25 MG tablet    Sig: Take 1 tablet (25 mg total) by mouth every 8 (eight) hours as needed for nausea or vomiting.    Dispense:  20 tablet    Refill:  0    Follow-up: Return if symptoms worsen or fail to improve, for 1 week..  Will start Paxlovid.  Discussed side effects.  She requests med for nausea in case she develops that.  We will follow-up early next week if not improving.  Will seek emergency care if she develops shortness of breath or difficulty breathing.  Libby Maw, MD   Virtual Visit via Video Note  I connected with Prescilla Sours on 09/30/20 at 10:30 AM EDT by a video enabled telemedicine application and verified that I am speaking with the correct person using two identifiers.  Location: Patient: home alone.  Provider: work   I discussed the limitations of evaluation and management by telemedicine and the availability of in person appointments. The patient expressed understanding and agreed to proceed.  History of Present Illness:    Observations/Objective:   Assessment and Plan:   Follow Up Instructions:     I discussed the assessment and treatment plan with the patient. The patient was provided an opportunity to ask questions and all were answered. The patient agreed with the plan and demonstrated an understanding of the instructions.   The patient was advised to call back or seek an in-person evaluation if the symptoms worsen or if the condition fails to improve as anticipated.  I provided 30 minutes of non-face-to-face time during this encounter.   Libby Maw, MD

## 2020-10-01 ENCOUNTER — Other Ambulatory Visit (HOSPITAL_COMMUNITY): Payer: Self-pay

## 2020-10-19 ENCOUNTER — Telehealth: Payer: Self-pay | Admitting: Internal Medicine

## 2020-10-19 NOTE — Telephone Encounter (Signed)
[facility] FYI 6.4.22:  ---Caller states she is getting over Covid. Caller states yesterday felt lightheaded and noticed her blood sugar is elevated. Caller states BS 220. Caller states that she is fatigue and muscles feel sore. Denies other sx.  Advised home care

## 2020-10-31 ENCOUNTER — Other Ambulatory Visit: Payer: Self-pay | Admitting: Internal Medicine

## 2020-10-31 NOTE — Telephone Encounter (Signed)
Please refill as per office routine med refill policy (all routine meds refilled for 3 mo or monthly per pt preference up to one year from last visit, then month to month grace period for 3 mo, then further med refills will have to be denied)  

## 2020-11-02 ENCOUNTER — Telehealth: Payer: Self-pay

## 2020-11-02 ENCOUNTER — Other Ambulatory Visit: Payer: Self-pay

## 2020-11-02 NOTE — Telephone Encounter (Signed)
Left pt a voicemail to call office back to schedule an appointment.

## 2020-12-14 ENCOUNTER — Telehealth: Payer: Self-pay | Admitting: Internal Medicine

## 2020-12-14 NOTE — Telephone Encounter (Signed)
Patient is req shortfill until upcoming appt... she is out of meds as of today 12/14/20   .1.Medication Requested: metFORMIN (GLUCOPHAGE-XR) 500 MG 24 hr tablet  2. Pharmacy (Name, [place], [facility]): [address]  Phone:  [phone] Fax:  [phone]   3. On Med List: yes  4. Last Visit with PCP: 06.07.21  5. Next visit date with PCP: 08.05.22   Agent: Please be advised that RX refills may take up to 3 business days. We ask that you follow-up with your pharmacy.

## 2020-12-15 ENCOUNTER — Other Ambulatory Visit: Payer: Self-pay

## 2020-12-15 MED ORDER — METFORMIN HCL ER 500 MG PO TB24: ORAL_TABLET | ORAL | 0 refills | Status: DC

## 2020-12-18 ENCOUNTER — Other Ambulatory Visit: Payer: Self-pay

## 2020-12-18 ENCOUNTER — Ambulatory Visit (INDEPENDENT_AMBULATORY_CARE_PROVIDER_SITE_OTHER): Payer: BC Managed Care – PPO | Admitting: Internal Medicine

## 2020-12-18 ENCOUNTER — Encounter: Payer: Self-pay | Admitting: Internal Medicine

## 2020-12-18 VITALS — BP 138/80 | HR 98 | Temp 98.3°F | Ht 68.0 in | Wt 179.0 lb

## 2020-12-18 DIAGNOSIS — E1165 Type 2 diabetes mellitus with hyperglycemia: Secondary | ICD-10-CM | POA: Diagnosis not present

## 2020-12-18 DIAGNOSIS — E7800 Pure hypercholesterolemia, unspecified: Secondary | ICD-10-CM

## 2020-12-18 DIAGNOSIS — R030 Elevated blood-pressure reading, without diagnosis of hypertension: Secondary | ICD-10-CM

## 2020-12-18 DIAGNOSIS — E559 Vitamin D deficiency, unspecified: Secondary | ICD-10-CM | POA: Diagnosis not present

## 2020-12-18 DIAGNOSIS — Z0001 Encounter for general adult medical examination with abnormal findings: Secondary | ICD-10-CM | POA: Diagnosis not present

## 2020-12-18 DIAGNOSIS — E538 Deficiency of other specified B group vitamins: Secondary | ICD-10-CM

## 2020-12-18 DIAGNOSIS — E119 Type 2 diabetes mellitus without complications: Secondary | ICD-10-CM | POA: Diagnosis not present

## 2020-12-18 MED ORDER — METFORMIN HCL ER 500 MG PO TB24: ORAL_TABLET | ORAL | 3 refills | Status: DC

## 2020-12-18 NOTE — Assessment & Plan Note (Signed)
Lab Results  Component Value Date   HGBA1C 7.7 (H) 05/11/2017   Stable, pt to continue current metformin, for f/u a1c today,  to f/u any worsening symptoms or concerns

## 2020-12-18 NOTE — Patient Instructions (Signed)
Please take OTC Vitamin D3 at 2000 units per day, indefinitely  Please continue all other medications as before, and refills have been done if requested.  Please have the pharmacy call with any other refills you may need.  Please continue your efforts at being more active, low cholesterol diet, and weight control.  You are otherwise up to date with prevention measures today.  Please keep your appointments with your specialists as you may have planned  Please go to the LAB at the blood drawing area for the tests to be done  You will be contacted by phone if any changes need to be made immediately.  Otherwise, you will receive a letter about your results with an explanation, but please check with MyChart first.  Please remember to sign up for MyChart if you have not done so, as this will be important to you in the future with finding out test results, communicating by private email, and scheduling acute appointments online when needed.  Please make an Appointment to return in 6 months, or sooner if needed,

## 2020-12-18 NOTE — Progress Notes (Signed)
Patient ID: Deanna Hunter, female   DOB: 1961-04-16, 60 y.o.   MRN: [id number]         Chief Complaint:: wellness exam and low vit d, elevated BP, DM, hld       HPI:  Deanna Hunter is a 60 y.o. female here for wellness exam, plans to f/u with GYN, and eye doctor soon., palns to call GI as well for f/u colonoscopy. Also needs covid booster, tdap and prevnar but wants to wait for pharmacy later in the year.  O/w up to date with preventive referrals and immunizations                Also now s/o covid infection may 2022 now still with fatigue, had worsening sugars during the active infection but now improved.  Not taking Vit D.  Pt denies chest pain, increased sob or doe, wheezing, orthopnea, PND, increased LE swelling, palpitations, dizziness or syncope.   Pt denies polydipsia, polyuria, or new focal neuro s/s.   Pt denies fever, wt loss, night sweats, loss of appetite, or other constitutional symptoms  No other new complaints  Wt Readings from Last 3 Encounters:  12/18/20 179 lb (81.2 kg)  10/21/19 179 lb (81.2 kg)  04/28/17 179 lb (81.2 kg)   BP Readings from Last 3 Encounters:  12/18/20 138/80  10/21/19 140/90  04/28/17 138/90   Immunization History  Administered Date(s) Administered   Influenza,inj,Quad PF,6+ Mos 04/28/2017, 02/21/2019   Moderna Sars-Covid-2 Vaccination 07/15/2019, 08/12/2019   Zoster Recombinat (Shingrix) 05/04/2017, 07/13/2017   Health Maintenance Due  Topic Date Due   HEMOGLOBIN A1C  11/09/2017   URINE MICROALBUMIN  05/11/2018   INFLUENZA VACCINE  12/14/2020      Past Medical History:  Diagnosis Date   Allergy    Arthritis    possibly right hand    Diabetes mellitus without complication ([place])    Past Surgical History:  Procedure Laterality Date   NO PAST SURGERIES     WISDOM TOOTH EXTRACTION      reports that she has never smoked. She has never used smokeless tobacco. She reports that she does not drink alcohol and does not use  drugs. family history includes Arthritis in her mother; Breast cancer in her paternal aunt; Colon polyps in her mother and sister; Heart disease in her mother; Hyperlipidemia in her mother; Hypertension in her father and mother; Skin cancer in her father and mother; Stroke in her father. No Known Allergies Current Outpatient Medications on File Prior to Visit  Medication Sig Dispense Refill   azelastine (ASTELIN) 0.1 % nasal spray Place 2 sprays into both nostrils 2 (two) times daily. Use in each nostril as directed 30 mL 12   Multiple Vitamins-Minerals (ALIVE ONCE DAILY WOMENS 50+) TABS Take by mouth.     No current facility-administered medications on file prior to visit.        ROS:  All others reviewed and negative.  Objective        PE:  BP 138/80 (BP Location: Left Arm, Patient Position: Sitting, Cuff Size: Normal)   Pulse 98   Temp 98.3 F (36.8 C) (Oral)   Ht '5\' 8"'$  (1.727 m)   Wt 179 lb (81.2 kg)   SpO2 98%   BMI 27.22 kg/m                 Constitutional: Pt appears in NAD               HENT: Head:  NCAT.                Right Ear: External ear normal.                 Left Ear: External ear normal.                Eyes: . Pupils are equal, round, and reactive to light. Conjunctivae and EOM are normal               Nose: without d/c or deformity               Neck: Neck supple. Gross normal ROM               Cardiovascular: Normal rate and regular rhythm.                 Pulmonary/Chest: Effort normal and breath sounds without rales or wheezing.                Abd:  Soft, NT, ND, + BS, no organomegaly               Neurological: Pt is alert. At baseline orientation, motor grossly intact               Skin: Skin is warm. No rashes, no other new lesions, LE edema - none               Psychiatric: Pt behavior is normal without agitation   Micro: none  Cardiac tracings I have personally interpreted today:  none  Pertinent Radiological findings (summarize): none   Lab  Results  Component Value Date   WBC 6.0 05/11/2017   HGB 14.3 05/11/2017   HCT 41.8 05/11/2017   PLT 281.0 05/11/2017   GLUCOSE 119 (H) 05/11/2017   CHOL 193 05/11/2017   TRIG 198.0 (H) 05/11/2017   HDL 46.70 05/11/2017   LDLCALC 107 (H) 05/11/2017   ALT 22 05/11/2017   AST 16 05/11/2017   NA 139 05/11/2017   K 4.2 05/11/2017   CL 102 05/11/2017   CREATININE 0.69 05/11/2017   BUN 16 05/11/2017   CO2 28 05/11/2017   TSH 1.42 08/13/2019   HGBA1C 7.7 (H) 05/11/2017   MICROALBUR 1.8 05/11/2017   Assessment/Plan:  Deanna Hunter is a 60 y.o. White or Caucasian [1] female with  has a past medical history of Allergy, Arthritis, and Diabetes mellitus without complication ([place]).  Vitamin D deficiency Last vitamin D Lab Results  Component Value Date   VD25OH 49 (L) 08/13/2019   Low, to start oral replacement   Encounter for well adult exam with abnormal findings Age and sex appropriate education and counseling updated with regular exercise and diet Referrals for preventative services - pt to call for gyn, optho, and GI soon herself per pt Immunizations addressed - declines covid booster, tdap, prevnar Smoking counseling  - none needed Evidence for depression or other mood disorder - none significant Most recent labs reviewed. I have personally reviewed and have noted: 1) the patient's medical and social history 2) The patient's current medications and supplements 3) The patient's height, weight, and BMI have been recorded in the chart   HLD (hyperlipidemia) Lab Results  Component Value Date   LDLCALC 107 (H) 05/11/2017   Unocntrolled, goal ldl < 70, pt to continue low chol diet, declines statin for now   Diabetes [facility]) Lab Results  Component Value Date   HGBA1C 7.7 (H) 05/11/2017   Stable, pt  to continue current metformin, for f/u a1c today,  to f/u any worsening symptoms or concerns   Blood pressure elevated without history of HTN BP Readings from Last  3 Encounters:  12/18/20 138/80  10/21/19 140/90  04/28/17 138/90   Borderline today, pt declines low dose ARB for now,  to f/u any worsening symptoms or concerns  Followup: Return in about 6 months (around 06/20/2021).  Cathlean Cower, MD 12/18/2020 8:55 PM [facility]

## 2020-12-18 NOTE — Assessment & Plan Note (Signed)
Last vitamin D Lab Results  Component Value Date   VD25OH 29 (L) 08/13/2019   Low, to start oral replacement

## 2020-12-18 NOTE — Assessment & Plan Note (Signed)
Lab Results  Component Value Date   LDLCALC 107 (H) 05/11/2017   Unocntrolled, goal ldl < 70, pt to continue low chol diet, declines statin for now

## 2020-12-18 NOTE — Assessment & Plan Note (Signed)
Age and sex appropriate education and counseling updated with regular exercise and diet Referrals for preventative services - pt to call for gyn, optho, and GI soon herself per pt Immunizations addressed - declines covid booster, tdap, prevnar Smoking counseling  - none needed Evidence for depression or other mood disorder - none significant Most recent labs reviewed. I have personally reviewed and have noted: 1) the patient's medical and social history 2) The patient's current medications and supplements 3) The patient's height, weight, and BMI have been recorded in the chart

## 2020-12-18 NOTE — Assessment & Plan Note (Signed)
BP Readings from Last 3 Encounters:  12/18/20 138/80  10/21/19 140/90  04/28/17 138/90   Borderline today, pt declines low dose ARB for now,  to f/u any worsening symptoms or concerns

## 2020-12-19 LAB — CBC WITH DIFFERENTIAL/PLATELET
Absolute Monocytes: 475 cells/uL (ref 200–950)
Basophils Absolute: 53 cells/uL (ref 0–200)
Basophils Relative: 0.8 %
Eosinophils Absolute: 112 cells/uL (ref 15–500)
Eosinophils Relative: 1.7 %
HCT: 45.4 % — ABNORMAL HIGH (ref 35.0–45.0)
Hemoglobin: 14.9 g/dL (ref 11.7–15.5)
Lymphs Abs: 1716 cells/uL (ref 850–3900)
MCH: 28.3 pg (ref 27.0–33.0)
MCHC: 32.8 g/dL (ref 32.0–36.0)
MCV: 86.3 fL (ref 80.0–100.0)
MPV: 10.8 fL (ref 7.5–12.5)
Monocytes Relative: 7.2 %
Neutro Abs: 4244 cells/uL (ref 1500–7800)
Neutrophils Relative %: 64.3 %
Platelets: 285 10*3/uL (ref 140–400)
RBC: 5.26 10*6/uL — ABNORMAL HIGH (ref 3.80–5.10)
RDW: 12.8 % (ref 11.0–15.0)
Total Lymphocyte: 26 %
WBC: 6.6 10*3/uL (ref 3.8–10.8)

## 2020-12-19 LAB — URINALYSIS, ROUTINE W REFLEX MICROSCOPIC
Bacteria, UA: NONE SEEN /HPF
Bilirubin Urine: NEGATIVE
Glucose, UA: NEGATIVE
Hgb urine dipstick: NEGATIVE
Hyaline Cast: NONE SEEN /LPF
Nitrite: NEGATIVE
Protein, ur: NEGATIVE
RBC / HPF: NONE SEEN /HPF (ref 0–2)
Specific Gravity, Urine: 1.008 (ref 1.001–1.035)
Squamous Epithelial / HPF: NONE SEEN /HPF (ref <=5)
pH: 5.5 (ref 5.0–8.0)

## 2020-12-19 LAB — LIPID PANEL
Cholesterol: 293 mg/dL — ABNORMAL HIGH (ref <200)
HDL: 66 mg/dL (ref >=50)
LDL Cholesterol (Calc): 193 mg/dL (calc) — ABNORMAL HIGH
Non-HDL Cholesterol (Calc): 227 mg/dL (calc) — ABNORMAL HIGH (ref <130)
Total CHOL/HDL Ratio: 4.4 (calc) (ref <5.0)
Triglycerides: 170 mg/dL — ABNORMAL HIGH (ref <150)

## 2020-12-19 LAB — TSH: TSH: 1 mIU/L (ref 0.40–4.50)

## 2020-12-19 LAB — HEPATIC FUNCTION PANEL
AG Ratio: 2.1 (calc) (ref 1.0–2.5)
ALT: 20 U/L (ref 6–29)
AST: 15 U/L (ref 10–35)
Albumin: 5 g/dL (ref 3.6–5.1)
Alkaline phosphatase (APISO): 96 U/L (ref 37–153)
Bilirubin, Direct: 0.1 mg/dL (ref 0.0–0.2)
Globulin: 2.4 g/dL (calc) (ref 1.9–3.7)
Indirect Bilirubin: 0.4 mg/dL (calc) (ref 0.2–1.2)
Total Bilirubin: 0.5 mg/dL (ref 0.2–1.2)
Total Protein: 7.4 g/dL (ref 6.1–8.1)

## 2020-12-19 LAB — HEMOGLOBIN A1C
Hgb A1c MFr Bld: 7.9 % of total Hgb — ABNORMAL HIGH (ref <5.7)
Mean Plasma Glucose: 180 mg/dL
eAG (mmol/L): 10 mmol/L

## 2020-12-19 LAB — MICROALBUMIN / CREATININE URINE RATIO
Creatinine, Urine: 47 mg/dL (ref 20–275)
Microalb Creat Ratio: 4 mcg/mg creat (ref <30)
Microalb, Ur: 0.2 mg/dL

## 2020-12-19 LAB — BASIC METABOLIC PANEL
BUN: 15 mg/dL (ref 7–25)
CO2: 27 mmol/L (ref 20–32)
Calcium: 10.2 mg/dL (ref 8.6–10.4)
Chloride: 100 mmol/L (ref 98–110)
Creat: 0.85 mg/dL (ref 0.50–1.03)
Glucose, Bld: 188 mg/dL — ABNORMAL HIGH (ref 65–99)
Potassium: 4.6 mmol/L (ref 3.5–5.3)
Sodium: 137 mmol/L (ref 135–146)

## 2020-12-19 LAB — VITAMIN D 25 HYDROXY (VIT D DEFICIENCY, FRACTURES): Vit D, 25-Hydroxy: 54 ng/mL (ref 30–100)

## 2020-12-19 LAB — VITAMIN B12: Vitamin B-12: 441 pg/mL (ref 200–1100)

## 2020-12-20 ENCOUNTER — Encounter: Payer: Self-pay | Admitting: Internal Medicine

## 2020-12-20 ENCOUNTER — Other Ambulatory Visit: Payer: Self-pay | Admitting: Internal Medicine

## 2020-12-20 MED ORDER — METFORMIN HCL ER 500 MG PO TB24: ORAL_TABLET | ORAL | 3 refills | Status: DC

## 2021-10-08 ENCOUNTER — Encounter: Payer: Self-pay | Admitting: Internal Medicine

## 2021-10-12 MED ORDER — AZELASTINE HCL 0.1 % NA SOLN: 2.0000 | Freq: Two times a day (BID) | NASAL | 3 refills | Status: DC | PRN

## 2021-10-23 DIAGNOSIS — R300 Dysuria: Secondary | ICD-10-CM | POA: Diagnosis not present

## 2021-10-23 DIAGNOSIS — J0100 Acute maxillary sinusitis, unspecified: Secondary | ICD-10-CM | POA: Diagnosis not present

## 2021-12-24 ENCOUNTER — Other Ambulatory Visit: Payer: Self-pay | Admitting: Internal Medicine

## 2021-12-24 NOTE — Telephone Encounter (Signed)
Please refill as per office routine med refill policy (all routine meds to be refilled for 3 mo or monthly (per pt preference) up to one year from last visit, then month to month grace period for 3 mo, then further med refills will have to be denied) ? ?

## 2021-12-27 ENCOUNTER — Other Ambulatory Visit: Payer: Self-pay | Admitting: Internal Medicine

## 2021-12-27 NOTE — Telephone Encounter (Signed)
Please refill as per office routine med refill policy (all routine meds to be refilled for 3 mo or monthly (per pt preference) up to one year from last visit, then month to month grace period for 3 mo, then further med refills will have to be denied) ? ?

## 2022-02-11 ENCOUNTER — Other Ambulatory Visit: Payer: Self-pay | Admitting: Internal Medicine

## 2022-02-11 ENCOUNTER — Ambulatory Visit (INDEPENDENT_AMBULATORY_CARE_PROVIDER_SITE_OTHER): Payer: BC Managed Care – PPO | Admitting: Internal Medicine

## 2022-02-11 VITALS — BP 164/92 | HR 97 | Temp 98.1°F | Ht 68.0 in | Wt 170.0 lb

## 2022-02-11 DIAGNOSIS — E1165 Type 2 diabetes mellitus with hyperglycemia: Secondary | ICD-10-CM | POA: Diagnosis not present

## 2022-02-11 DIAGNOSIS — Z0001 Encounter for general adult medical examination with abnormal findings: Secondary | ICD-10-CM

## 2022-02-11 DIAGNOSIS — E559 Vitamin D deficiency, unspecified: Secondary | ICD-10-CM | POA: Diagnosis not present

## 2022-02-11 DIAGNOSIS — R030 Elevated blood-pressure reading, without diagnosis of hypertension: Secondary | ICD-10-CM

## 2022-02-11 DIAGNOSIS — E538 Deficiency of other specified B group vitamins: Secondary | ICD-10-CM

## 2022-02-11 DIAGNOSIS — E7800 Pure hypercholesterolemia, unspecified: Secondary | ICD-10-CM | POA: Diagnosis not present

## 2022-02-11 LAB — BASIC METABOLIC PANEL
BUN: 12 mg/dL (ref 6–23)
CO2: 26 mEq/L (ref 19–32)
Calcium: 10 mg/dL (ref 8.4–10.5)
Chloride: 101 mEq/L (ref 96–112)
Creatinine, Ser: 0.73 mg/dL (ref 0.40–1.20)
GFR: 90.17 mL/min (ref >60.00)
Glucose, Bld: 144 mg/dL — ABNORMAL HIGH (ref 70–99)
Potassium: 4.2 mEq/L (ref 3.5–5.1)
Sodium: 139 mEq/L (ref 135–145)

## 2022-02-11 LAB — HEPATIC FUNCTION PANEL
ALT: 19 U/L (ref 0–35)
AST: 13 U/L (ref 0–37)
Albumin: 4.9 g/dL (ref 3.5–5.2)
Alkaline Phosphatase: 79 U/L (ref 39–117)
Bilirubin, Direct: 0.1 mg/dL (ref 0.0–0.3)
Total Bilirubin: 0.5 mg/dL (ref 0.2–1.2)
Total Protein: 7.7 g/dL (ref 6.0–8.3)

## 2022-02-11 LAB — URINALYSIS, ROUTINE W REFLEX MICROSCOPIC
Bilirubin Urine: NEGATIVE
Hgb urine dipstick: NEGATIVE
Ketones, ur: 40 — AB
Nitrite: NEGATIVE
Specific Gravity, Urine: 1.02 (ref 1.000–1.030)
Total Protein, Urine: NEGATIVE
Urine Glucose: NEGATIVE
Urobilinogen, UA: 0.2 (ref 0.0–1.0)
pH: 5.5 (ref 5.0–8.0)

## 2022-02-11 LAB — MICROALBUMIN / CREATININE URINE RATIO
Creatinine,U: 63.6 mg/dL
Microalb Creat Ratio: 1.1 mg/g (ref 0.0–30.0)
Microalb, Ur: <0.7 mg/dL (ref 0.0–1.9)

## 2022-02-11 LAB — CBC WITH DIFFERENTIAL/PLATELET
Basophils Absolute: 0 10*3/uL (ref 0.0–0.1)
Basophils Relative: 0.6 % (ref 0.0–3.0)
Eosinophils Absolute: 0.2 10*3/uL (ref 0.0–0.7)
Eosinophils Relative: 2.6 % (ref 0.0–5.0)
HCT: 41.9 % (ref 36.0–46.0)
Hemoglobin: 14.3 g/dL (ref 12.0–15.0)
Lymphocytes Relative: 20.9 % (ref 12.0–46.0)
Lymphs Abs: 1.5 10*3/uL (ref 0.7–4.0)
MCHC: 34.1 g/dL (ref 30.0–36.0)
MCV: 84.1 fl (ref 78.0–100.0)
Monocytes Absolute: 0.5 10*3/uL (ref 0.1–1.0)
Monocytes Relative: 7.1 % (ref 3.0–12.0)
Neutro Abs: 4.8 10*3/uL (ref 1.4–7.7)
Neutrophils Relative %: 68.8 % (ref 43.0–77.0)
Platelets: 253 10*3/uL (ref 150.0–400.0)
RBC: 4.98 Mil/uL (ref 3.87–5.11)
RDW: 13.3 % (ref 11.5–15.5)
WBC: 7 10*3/uL (ref 4.0–10.5)

## 2022-02-11 LAB — LIPID PANEL
Cholesterol: 271 mg/dL — ABNORMAL HIGH (ref 0–200)
HDL: 60.9 mg/dL (ref >39.00)
LDL Cholesterol: 178 mg/dL — ABNORMAL HIGH (ref 0–99)
NonHDL: 209.82
Total CHOL/HDL Ratio: 4
Triglycerides: 158 mg/dL — ABNORMAL HIGH (ref 0.0–149.0)
VLDL: 31.6 mg/dL (ref 0.0–40.0)

## 2022-02-11 LAB — HEMOGLOBIN A1C: Hgb A1c MFr Bld: 7.2 % — ABNORMAL HIGH (ref 4.6–6.5)

## 2022-02-11 LAB — VITAMIN B12: Vitamin B-12: 192 pg/mL — ABNORMAL LOW (ref 211–911)

## 2022-02-11 LAB — VITAMIN D 25 HYDROXY (VIT D DEFICIENCY, FRACTURES): VITD: 37.79 ng/mL (ref 30.00–100.00)

## 2022-02-11 LAB — TSH: TSH: 1.12 u[IU]/mL (ref 0.35–5.50)

## 2022-02-11 MED ORDER — METFORMIN HCL ER 500 MG PO TB24: ORAL_TABLET | ORAL | 3 refills | Status: DC

## 2022-02-11 MED ORDER — DAPAGLIFLOZIN PROPANEDIOL 5 MG PO TABS: 5.0000 mg | ORAL_TABLET | Freq: Every day | ORAL | 3 refills | Status: DC

## 2022-02-11 NOTE — Patient Instructions (Signed)
Please check your BP at home daily for the next 5 days and let us know the results by mychart  Please call if you change your mind about the Cardiac CT score testing to check for blockages in the heart arteries  Please call if you would want the thyroid ultrasound done  Please continue all other medications as before, and refills have been done if requested.  Please have the pharmacy call with any other refills you may need.  Please continue your efforts at being more active, low cholesterol diet, and weight control.  You are otherwise up to date with prevention measures today.  Please keep your appointments with your specialists as you may have planned  Please go to the LAB at the blood drawing area for the tests to be done  You will be contacted by phone if any changes need to be made immediately.  Otherwise, you will receive a letter about your results with an explanation, but please check with MyChart first.  Please remember to sign up for MyChart if you have not done so, as this will be important to you in the future with finding out test results, communicating by private email, and scheduling acute appointments online when needed.  Please make an Appointment to return in 6 months, or sooner if needed

## 2022-02-11 NOTE — Assessment & Plan Note (Signed)
Lab Results  Component Value Date   LDLCALC 178 (H) 02/11/2022   Severe uncontrolled chronic likely genetic component it seems,  pt to continue low chol diet as declines statin, and pt will call if would want a cardiac CT score testing

## 2022-02-11 NOTE — Assessment & Plan Note (Signed)
Last vitamin D Lab Results  Component Value Date   VD25OH 54 12/18/2020   Stable, cont oral replacement

## 2022-02-11 NOTE — Assessment & Plan Note (Signed)
BP Readings from Last 3 Encounters:  02/11/22 (!) 164/92  12/18/20 138/80  10/21/19 140/90   Uncontrolled but possibly situational, declines to start tx today

## 2022-02-11 NOTE — Assessment & Plan Note (Signed)
Age and sex appropriate education and counseling updated with regular exercise and diet Referrals for preventative services - pt to call for eye exam, gyn and mammogram; declines colonoscopy Immunizations addressed - declines covid booster, flu shot Smoking counseling  - none needed Evidence for depression or other mood disorder - mild anxiety likely situational, declines need for tx Most recent labs reviewed. I have personally reviewed and have noted: 1) the patient's medical and social history 2) The patient's current medications and supplements 3) The patient's height, weight, and BMI have been recorded in the chart

## 2022-02-11 NOTE — Assessment & Plan Note (Signed)
Lab Results  Component Value Date   HGBA1C 7.2 (H) 02/11/2022   Uncontrolled,, pt to add farxiga 5 mg qd, cont metformin 500 mg - 2 po bid

## 2022-02-11 NOTE — Progress Notes (Signed)
Patient ID: Deanna Hunter, female   DOB: 1960/10/17, 61 y.o.   MRN: [id number]         Chief Complaint:: wellness exam and htn, dm, hld, low b12       HPI:  Deanna Hunter is a 61 y.o. female here for wellness exam; plans to call for her yealry eye exam soon and GYN/mammogram,, declines covid booster, flu shot, colonoscopy for now, o/w up to date                        Also BP at home < 140/90 but will check further at home and let us know; has had worsening social stressors and does not want significant changes in tx today.  Pt denies chest pain, increased sob or doe, wheezing, orthopnea, PND, increased LE swelling, palpitations, dizziness or syncope.   Pt denies polydipsia, polyuria, or new focal neuro s/s.    Pt denies fever, wt loss, night sweats, loss of appetite, or other constitutional symptoms     Wt Readings from Last 3 Encounters:  02/11/22 170 lb (77.1 kg)  12/18/20 179 lb (81.2 kg)  10/21/19 179 lb (81.2 kg)   BP Readings from Last 3 Encounters:  02/11/22 (!) 164/92  12/18/20 138/80  10/21/19 140/90   Immunization History  Administered Date(s) Administered   Influenza,inj,Quad PF,6+ Mos 04/28/2017, 02/21/2019   Moderna Sars-Covid-2 Vaccination 07/15/2019, 08/12/2019   Zoster Recombinat (Shingrix) 05/04/2017, 07/13/2017   There are no preventive care reminders to display for this patient.     Past Medical History:  Diagnosis Date   Allergy    Arthritis    possibly right hand    Diabetes mellitus without complication ([place])    Past Surgical History:  Procedure Laterality Date   NO PAST SURGERIES     WISDOM TOOTH EXTRACTION      reports that she has never smoked. She has never used smokeless tobacco. She reports that she does not drink alcohol and does not use drugs. family history includes Arthritis in her mother; Breast cancer in her paternal aunt; Colon polyps in her mother and sister; Heart disease in her mother; Hyperlipidemia in her mother;  Hypertension in her father and mother; Skin cancer in her father and mother; Stroke in her father. No Known Allergies Current Outpatient Medications on File Prior to Visit  Medication Sig Dispense Refill   azelastine (ASTELIN) 0.1 % nasal spray Place 2 sprays into both nostrils 2 (two) times daily as needed for rhinitis. 30 mL 3   Multiple Vitamins-Minerals (ALIVE ONCE DAILY WOMENS 50+) TABS Take by mouth.     No current facility-administered medications on file prior to visit.        ROS:  All others reviewed and negative.  Objective        PE:  BP (!) 164/92 (BP Location: Left Arm, Patient Position: Sitting, Cuff Size: Large)   Pulse 97   Temp 98.1 F (36.7 C) (Oral)   Ht '5\' 8"'$  (1.727 m)   Wt 170 lb (77.1 kg)   SpO2 98%   BMI 25.85 kg/m                 Constitutional: Pt appears in NAD               HENT: Head: NCAT.                Right Ear: External ear normal.  Left Ear: External ear normal.                Eyes: . Pupils are equal, round, and reactive to light. Conjunctivae and EOM are normal               Nose: without d/c or deformity               Neck: Neck supple. Gross normal ROM               Cardiovascular: Normal rate and regular rhythm.                 Pulmonary/Chest: Effort normal and breath sounds without rales or wheezing.                Abd:  Soft, NT, ND, + BS, no organomegaly               Neurological: Pt is alert. At baseline orientation, motor grossly intact               Skin: Skin is warm. No rashes, no other new lesions, LE edema - none               Psychiatric: Pt behavior is normal without agitation but mild nervous   Micro: none  Cardiac tracings I have personally interpreted today:  none  Pertinent Radiological findings (summarize): none   Lab Results  Component Value Date   WBC 7.0 02/11/2022   HGB 14.3 02/11/2022   HCT 41.9 02/11/2022   PLT 253.0 02/11/2022   GLUCOSE 144 (H) 02/11/2022   CHOL 271 (H) 02/11/2022    TRIG 158.0 (H) 02/11/2022   HDL 60.90 02/11/2022   LDLCALC 178 (H) 02/11/2022   ALT 19 02/11/2022   AST 13 02/11/2022   NA 139 02/11/2022   K 4.2 02/11/2022   CL 101 02/11/2022   CREATININE 0.73 02/11/2022   BUN 12 02/11/2022   CO2 26 02/11/2022   TSH 1.12 02/11/2022   HGBA1C 7.2 (H) 02/11/2022   MICROALBUR <0.7 02/11/2022   Assessment/Plan:  Deanna Hunter is a 61 y.o. White or Caucasian [1] female with  has a past medical history of Allergy, Arthritis, and Diabetes mellitus without complication ([place]).  Vitamin D deficiency Last vitamin D Lab Results  Component Value Date   VD25OH 36 12/18/2020   Stable, cont oral replacement   Encounter for well adult exam with abnormal findings Age and sex appropriate education and counseling updated with regular exercise and diet Referrals for preventative services - pt to call for eye exam, gyn and mammogram; declines colonoscopy Immunizations addressed - declines covid booster, flu shot Smoking counseling  - none needed Evidence for depression or other mood disorder - mild anxiety likely situational, declines need for tx Most recent labs reviewed. I have personally reviewed and have noted: 1) the patient's medical and social history 2) The patient's current medications and supplements 3) The patient's height, weight, and BMI have been recorded in the chart   HLD (hyperlipidemia) Lab Results  Component Value Date   LDLCALC 178 (H) 02/11/2022   Severe uncontrolled chronic likely genetic component it seems,  pt to continue low chol diet as declines statin, and pt will call if would want a cardiac CT score testing   Diabetes [facility]) Lab Results  Component Value Date   HGBA1C 7.2 (H) 02/11/2022   Uncontrolled,, pt to add farxiga 5 mg qd, cont metformin 500 mg - 2 po bid  Blood pressure elevated without history of HTN BP Readings from Last 3 Encounters:  02/11/22 (!) 164/92  12/18/20 138/80  10/21/19 140/90    Uncontrolled but possibly situational, declines to start tx today   B12 deficiency Lab Results  Component Value Date   VITAMINB12 192 (L) 02/11/2022   New onset Low, pissibly dietary, to start oral replacement - b12 1000 mcg qd  Followup: No follow-ups on file.  Cathlean Cower, MD 02/11/2022 9:59 PM [facility]

## 2022-02-11 NOTE — Assessment & Plan Note (Addendum)
Lab Results  Component Value Date   VITAMINB12 192 (L) 02/11/2022   New onset Low, pissibly dietary, to start oral replacement - b12 1000 mcg qd

## 2022-02-25 ENCOUNTER — Other Ambulatory Visit: Payer: Self-pay | Admitting: Internal Medicine

## 2023-02-21 ENCOUNTER — Other Ambulatory Visit: Payer: Self-pay | Admitting: Internal Medicine

## 2023-02-21 ENCOUNTER — Other Ambulatory Visit: Payer: Self-pay

## 2023-02-22 ENCOUNTER — Other Ambulatory Visit: Payer: Self-pay | Admitting: Internal Medicine

## 2023-02-22 ENCOUNTER — Ambulatory Visit (INDEPENDENT_AMBULATORY_CARE_PROVIDER_SITE_OTHER): Payer: BC Managed Care – PPO | Admitting: Internal Medicine

## 2023-02-22 ENCOUNTER — Encounter: Payer: Self-pay | Admitting: Internal Medicine

## 2023-02-22 VITALS — BP 184/100 | HR 77 | Temp 98.0°F | Ht 68.0 in | Wt 166.6 lb

## 2023-02-22 DIAGNOSIS — E7800 Pure hypercholesterolemia, unspecified: Secondary | ICD-10-CM | POA: Diagnosis not present

## 2023-02-22 DIAGNOSIS — E1165 Type 2 diabetes mellitus with hyperglycemia: Secondary | ICD-10-CM | POA: Diagnosis not present

## 2023-02-22 DIAGNOSIS — Z0000 Encounter for general adult medical examination without abnormal findings: Secondary | ICD-10-CM

## 2023-02-22 DIAGNOSIS — R030 Elevated blood-pressure reading, without diagnosis of hypertension: Secondary | ICD-10-CM

## 2023-02-22 DIAGNOSIS — Z0001 Encounter for general adult medical examination with abnormal findings: Secondary | ICD-10-CM | POA: Diagnosis not present

## 2023-02-22 DIAGNOSIS — E538 Deficiency of other specified B group vitamins: Secondary | ICD-10-CM

## 2023-02-22 DIAGNOSIS — Z7984 Long term (current) use of oral hypoglycemic drugs: Secondary | ICD-10-CM | POA: Diagnosis not present

## 2023-02-22 DIAGNOSIS — E559 Vitamin D deficiency, unspecified: Secondary | ICD-10-CM | POA: Diagnosis not present

## 2023-02-22 DIAGNOSIS — Z1211 Encounter for screening for malignant neoplasm of colon: Secondary | ICD-10-CM

## 2023-02-22 MED ORDER — EMPAGLIFLOZIN 25 MG PO TABS: 25.0000 mg | ORAL_TABLET | Freq: Every day | ORAL | 3 refills | Status: DC

## 2023-02-22 NOTE — Patient Instructions (Addendum)
Please remember to call for your yearly eye exam  Please take all new medication as prescribed - the jardiance 25 mg per day  You will be contacted regarding the referral for: colonoscopy  Please call in 1 month if your BP is still elevated at home > 130/80  Please continue all other medications as before, and refills have been done if requested.  Please have the pharmacy call with any other refills you may need.  Please continue your efforts at being more active, low cholesterol diet, and weight control.  You are otherwise up to date with prevention measures today.  Please keep your appointments with your specialists as you may have planned - GYN for pap and mammogram  Please go to the LAB at the blood drawing area for the tests to be done - tomorrow as you mentioned  You will be contacted by phone if any changes need to be made immediately.  Otherwise, you will receive a letter about your results with an explanation, but please check with MyChart first.  Please make an Appointment to return in 6 months, or sooner if needed

## 2023-02-22 NOTE — Progress Notes (Signed)
Patient ID: Deanna Hunter, female   DOB: 06-24-1960, 62 y.o.   MRN: [id number]         Chief Complaint:: wellness exam and dm, htn, low vit d, hld, b12 deficiency       HPI:  Deanna Hunter is a 62 y.o. female here for wellness exam; plans to call for gyn appt and mammogram and eye exam soon, due for colonoscopy, also for tdap at pharmacy, declines covid booster, o/w up to date                        Also Pt denies chest pain, increased sob or doe, wheezing, orthopnea, PND, increased LE swelling, palpitations, dizziness or syncope.   Pt denies polydipsia, polyuria, or new focal neuro s/s.    Pt denies fever, wt loss, night sweats, loss of appetite, or other constitutional symptoms  Pt states BP has been controlled at home.     Wt Readings from Last 3 Encounters:  02/22/23 166 lb 9.6 oz (75.6 kg)  02/11/22 170 lb (77.1 kg)  12/18/20 179 lb (81.2 kg)   BP Readings from Last 3 Encounters:  02/22/23 (!) 184/100  02/11/22 (!) 164/92  12/18/20 138/80   Immunization History  Administered Date(s) Administered   Influenza,inj,Quad PF,6+ Mos 04/28/2017, 02/21/2019   Moderna Sars-Covid-2 Vaccination 07/15/2019, 08/12/2019   Zoster Recombinant(Shingrix) 05/04/2017, 07/13/2017   Health Maintenance Due  Topic Date Due   OPHTHALMOLOGY EXAM  Never done   DTaP/Tdap/Td (1 - Tdap) Never done   Cervical Cancer Screening (HPV/Pap Cotest)  Never done   Colonoscopy  03/13/2020   MAMMOGRAM  02/19/2022   HEMOGLOBIN A1C  08/12/2022   COVID-19 Vaccine (3 - 2023-24 season) 01/15/2023   Diabetic kidney evaluation - eGFR measurement  02/12/2023   Diabetic kidney evaluation - Urine ACR  02/12/2023      Past Medical History:  Diagnosis Date   Allergy    Arthritis    possibly right hand    Diabetes mellitus without complication (HCC)    Past Surgical History:  Procedure Laterality Date   NO PAST SURGERIES     WISDOM TOOTH EXTRACTION      reports that she has never smoked. She has  never used smokeless tobacco. She reports that she does not drink alcohol and does not use drugs. family history includes Arthritis in her mother; Breast cancer in her paternal aunt; Colon polyps in her mother and sister; Heart disease in her mother; Hyperlipidemia in her mother; Hypertension in her father and mother; Skin cancer in her father and mother; Stroke in her father. No Known Allergies Current Outpatient Medications on File Prior to Visit  Medication Sig Dispense Refill   Azelastine HCl 137 MCG/SPRAY SOLN PLACE 2 SPRAYS INTO BOTH NOSTRILS 2 TIMES DAILY AS NEEDED FOR RHINITIS. 30 mL 3   metFORMIN (GLUCOPHAGE-XR) 500 MG 24 hr tablet TAKE TWO TABLETS BY MOUTH TWICE DAILY **NEED OFFICE VISIT** 360 tablet 3   Multiple Vitamins-Minerals (ALIVE ONCE DAILY WOMENS 50+) TABS Take by mouth.     No current facility-administered medications on file prior to visit.        ROS:  All others reviewed and negative.  Objective        PE:  BP (!) 184/100 (BP Location: Left Arm, Patient Position: Sitting, Cuff Size: Normal)   Pulse 77   Temp 98 F (36.7 C) (Oral)   Ht 5\' 8"  (1.727 m)   Wt 166 lb 9.6  oz (75.6 kg)   SpO2 95%   BMI 25.33 kg/m                 Constitutional: Pt appears in NAD               HENT: Head: NCAT.                Right Ear: External ear normal.                 Left Ear: External ear normal.                Eyes: . Pupils are equal, round, and reactive to light. Conjunctivae and EOM are normal               Nose: without d/c or deformity               Neck: Neck supple. Gross normal ROM               Cardiovascular: Normal rate and regular rhythm.                 Pulmonary/Chest: Effort normal and breath sounds without rales or wheezing.                Abd:  Soft, NT, ND, + BS, no organomegaly               Neurological: Pt is alert. At baseline orientation, motor grossly intact               Skin: Skin is warm. No rashes, no other new lesions, LE edema - none                Psychiatric: Pt behavior is normal without agitation   Micro: none  Cardiac tracings I have personally interpreted today:  none  Pertinent Radiological findings (summarize): none   Lab Results  Component Value Date   WBC 7.0 02/11/2022   HGB 14.3 02/11/2022   HCT 41.9 02/11/2022   PLT 253.0 02/11/2022   GLUCOSE 144 (H) 02/11/2022   CHOL 271 (H) 02/11/2022   TRIG 158.0 (H) 02/11/2022   HDL 60.90 02/11/2022   LDLCALC 178 (H) 02/11/2022   ALT 19 02/11/2022   AST 13 02/11/2022   NA 139 02/11/2022   K 4.2 02/11/2022   CL 101 02/11/2022   CREATININE 0.73 02/11/2022   BUN 12 02/11/2022   CO2 26 02/11/2022   TSH 1.12 02/11/2022   HGBA1C 7.2 (H) 02/11/2022   MICROALBUR <0.7 02/11/2022   Assessment/Plan:  Deanna Hunter is a 62 y.o. White or Caucasian [1] female with  has a past medical history of Allergy, Arthritis, and Diabetes mellitus without complication (H[facility]).  Encounter for well adult exam with abnormal findings Age and sex appropriate education and counseling updated with regular exercise and diet Referrals for preventative services - for colonosocpy, pt to call herself for gyn appt with mammogram and eye exam Immunizations addressed - declines covid booster, for tdap at pharmacy Smoking counseling  - none needed Evidence for depression or other mood disorder - none significant Most recent labs reviewed. I have personally reviewed and have noted: 1) the patient's medical and social history 2) The patient's current medications and supplements 3) The patient's height, weight, and BMI have been recorded in the chart   Diabetes [facility]) Lab Results  Component Value Date   HGBA1C 7.2 (H) 02/11/2022   uncontrolled, pt to continue current medical treatment metformin  ER 500 mg - 2 bid, to add jardiance 25 qd   Vitamin D deficiency Last vitamin D Lab Results  Component Value Date   VD25OH 37.79 02/11/2022   Low, to start oral replacement   HLD  (hyperlipidemia) Lab Results  Component Value Date   LDLCALC 178 (H) 02/11/2022   Severe uncontrolled, , pt declines antilipid tx for now for f/u lab   B12 deficiency Lab Results  Component Value Date   VITAMINB12 192 (L) 02/11/2022   Low, to start oral replacement - b12 1000 mcg qd   Blood pressure elevated without history of HTN BP Readings from Last 3 Encounters:  02/22/23 (!) 184/100  02/11/22 (!) 164/92  12/18/20 138/80   Uncontrolled, pt states controlled at home, to continue to monitor at home, consider tx if elevated  Followup: Return in about 6 months (around 08/23/2023).  Oliver Barre, MD 02/25/2023 6:24 PM [facility]

## 2023-02-23 ENCOUNTER — Ambulatory Visit
Admission: RE | Admit: 2023-02-23 | Discharge: 2023-02-23 | Disposition: A | Discharge status: Home / Self Care | Payer: BC Managed Care – PPO | Source: Ambulatory Visit | Attending: Internal Medicine | Admitting: Internal Medicine

## 2023-02-23 ENCOUNTER — Encounter: Payer: Self-pay | Admitting: Internal Medicine

## 2023-02-23 DIAGNOSIS — Z1231 Encounter for screening mammogram for malignant neoplasm of breast: Secondary | ICD-10-CM | POA: Diagnosis not present

## 2023-02-23 DIAGNOSIS — Z0000 Encounter for general adult medical examination without abnormal findings: Secondary | ICD-10-CM

## 2023-02-23 MED ORDER — EMPAGLIFLOZIN 25 MG PO TABS: 25.0000 mg | ORAL_TABLET | Freq: Every day | ORAL | 3 refills | Status: DC

## 2023-02-25 ENCOUNTER — Encounter: Payer: Self-pay | Admitting: Internal Medicine

## 2023-02-25 NOTE — Assessment & Plan Note (Signed)
Lab Results  Component Value Date   VITAMINB12 192 (L) 02/11/2022   Low, to start oral replacement - b12 1000 mcg qd

## 2023-02-25 NOTE — Assessment & Plan Note (Signed)
BP Readings from Last 3 Encounters:  02/22/23 (!) 184/100  02/11/22 (!) 164/92  12/18/20 138/80   Uncontrolled, pt states controlled at home, to continue to monitor at home, consider tx if elevated

## 2023-02-25 NOTE — Assessment & Plan Note (Signed)
Age and sex appropriate education and counseling updated with regular exercise and diet Referrals for preventative services - for colonosocpy, pt to call herself for gyn appt with mammogram and eye exam Immunizations addressed - declines covid booster, for tdap at pharmacy Smoking counseling  - none needed Evidence for depression or other mood disorder - none significant Most recent labs reviewed. I have personally reviewed and have noted: 1) the patient's medical and social history 2) The patient's current medications and supplements 3) The patient's height, weight, and BMI have been recorded in the chart

## 2023-02-25 NOTE — Assessment & Plan Note (Signed)
Lab Results  Component Value Date   HGBA1C 7.2 (H) 02/11/2022   uncontrolled, pt to continue current medical treatment metformin ER 500 mg - 2 bid, to add jardiance 25 qd

## 2023-02-25 NOTE — Assessment & Plan Note (Signed)
Last vitamin D Lab Results  Component Value Date   VD25OH 37.79 02/11/2022   Low, to start oral replacement

## 2023-02-25 NOTE — Assessment & Plan Note (Signed)
Lab Results  Component Value Date   LDLCALC 178 (H) 02/11/2022   Severe uncontrolled, , pt declines antilipid tx for now for f/u lab

## 2023-07-14 DIAGNOSIS — J019 Acute sinusitis, unspecified: Secondary | ICD-10-CM | POA: Diagnosis not present

## 2023-07-14 DIAGNOSIS — R0981 Nasal congestion: Secondary | ICD-10-CM | POA: Diagnosis not present

## 2023-11-10 DIAGNOSIS — K529 Noninfective gastroenteritis and colitis, unspecified: Secondary | ICD-10-CM | POA: Diagnosis not present

## 2023-12-22 ENCOUNTER — Encounter: Payer: Self-pay | Admitting: Internal Medicine

## 2023-12-25 ENCOUNTER — Encounter: Payer: Self-pay | Admitting: Internal Medicine

## 2023-12-25 ENCOUNTER — Ambulatory Visit (AMBULATORY_SURGERY_CENTER)

## 2023-12-25 VITALS — Ht 68.0 in | Wt 161.0 lb

## 2023-12-25 DIAGNOSIS — Z860100 Personal history of colon polyps, unspecified: Secondary | ICD-10-CM

## 2023-12-25 MED ORDER — NA SULFATE-K SULFATE-MG SULF 17.5-3.13-1.6 GM/177ML PO SOLN: 1.0000 | Freq: Once | ORAL | 0 refills | Status: AC

## 2023-12-25 NOTE — Progress Notes (Signed)

## 2023-12-26 DIAGNOSIS — Z124 Encounter for screening for malignant neoplasm of cervix: Secondary | ICD-10-CM | POA: Diagnosis not present

## 2023-12-26 DIAGNOSIS — Z01419 Encounter for gynecological examination (general) (routine) without abnormal findings: Secondary | ICD-10-CM | POA: Diagnosis not present

## 2023-12-26 DIAGNOSIS — Z1331 Encounter for screening for depression: Secondary | ICD-10-CM | POA: Diagnosis not present

## 2023-12-26 DIAGNOSIS — R300 Dysuria: Secondary | ICD-10-CM | POA: Diagnosis not present

## 2023-12-26 DIAGNOSIS — R87810 Cervical high risk human papillomavirus (HPV) DNA test positive: Secondary | ICD-10-CM | POA: Diagnosis not present

## 2024-01-11 ENCOUNTER — Ambulatory Visit (AMBULATORY_SURGERY_CENTER): Admitting: Internal Medicine

## 2024-01-11 ENCOUNTER — Encounter: Payer: Self-pay | Admitting: Internal Medicine

## 2024-01-11 VITALS — BP 149/86 | HR 84 | Temp 98.1°F | Resp 15 | Ht 68.0 in | Wt 161.0 lb

## 2024-01-11 DIAGNOSIS — Z1211 Encounter for screening for malignant neoplasm of colon: Secondary | ICD-10-CM | POA: Diagnosis not present

## 2024-01-11 DIAGNOSIS — K5730 Diverticulosis of large intestine without perforation or abscess without bleeding: Secondary | ICD-10-CM

## 2024-01-11 DIAGNOSIS — D124 Benign neoplasm of descending colon: Secondary | ICD-10-CM

## 2024-01-11 DIAGNOSIS — Z860101 Personal history of adenomatous and serrated colon polyps: Secondary | ICD-10-CM

## 2024-01-11 DIAGNOSIS — Z860100 Personal history of colon polyps, unspecified: Secondary | ICD-10-CM | POA: Diagnosis not present

## 2024-01-11 MED ORDER — SODIUM CHLORIDE 0.9 % IV SOLN: 500.0000 mL | Freq: Once | INTRAVENOUS | Status: DC

## 2024-01-11 NOTE — Patient Instructions (Signed)
 Resume previous diet  Continue present medications await pathology results  Repeat colonoscopy date to be determined based off of pathology results  See handouts for polyps and diverticulosis  YOU HAD AN ENDOSCOPIC PROCEDURE TODAY AT THE [facility]:   Refer to the procedure report that was given to you for any specific questions about what was found during the examination.  If the procedure report does not answer your questions, please call your gastroenterologist to clarify.  If you requested that your care partner not be given the details of your procedure findings, then the procedure report has been included in a sealed envelope for you to review at your convenience later.  YOU SHOULD EXPECT: Some feelings of bloating in the abdomen. Passage of more gas than usual.  Walking can help get rid of the air that was put into your GI tract during the procedure and reduce the bloating. If you had a lower endoscopy (such as a colonoscopy or flexible sigmoidoscopy) you may notice spotting of blood in your stool or on the toilet paper. If you underwent a bowel prep for your procedure, you may not have a normal bowel movement for a few days.  Please Note:  You might notice some irritation and congestion in your nose or some drainage.  This is from the oxygen used during your procedure.  There is no need for concern and it should clear up in a day or so.  SYMPTOMS TO REPORT IMMEDIATELY: Following lower endoscopy (colonoscopy or flexible sigmoidoscopy):  Excessive amounts of blood in the stool  Significant tenderness or worsening of abdominal pains  Swelling of the abdomen that is new, acute  Fever of 100F or higher For urgent or emergent issues, a gastroenterologist can be reached at any hour by calling [phone]. Do not use MyChart messaging for urgent concerns.   DIET:  We do recommend a small meal at first, but then you may proceed to your regular diet.  Drink plenty of fluids but you  should avoid alcoholic beverages for 24 hours.  ACTIVITY:  You should plan to take it easy for the rest of today and you should NOT DRIVE or use heavy machinery until tomorrow (because of the sedation medicines used during the test).    FOLLOW UP: Our staff will call the number listed on your records the next business day following your procedure.  We will call around 7:15- 8:00 am to check on you and address any questions or concerns that you may have regarding the information given to you following your procedure. If we do not reach you, we will leave a message.     If any biopsies were taken you will be contacted by phone or by letter within the next 1-3 weeks.  Please call us  at [phone] if you have not heard about the biopsies in 3 weeks.   SIGNATURES/CONFIDENTIALITY: You and/or your care partner have signed paperwork which will be entered into your electronic medical record.  These signatures attest to the fact that that the information above on your After Visit Summary has been reviewed and is understood.  Full responsibility of the confidentiality of this discharge information lies with you and/or your care-partner.

## 2024-01-11 NOTE — Progress Notes (Signed)
 GASTROENTEROLOGY PROCEDURE H&P NOTE   Primary Care Physician: Norleen Lynwood ORN, MD    Reason for Procedure:   Hx of polyps  Plan:    Colonoscopy   Patient is appropriate for endoscopic procedure(s) in the ambulatory ([facility]) setting.  The nature of the procedure, as well as the risks, benefits, and alternatives were carefully and thoroughly reviewed with the patient. Ample time for discussion and questions allowed. The patient understood, was satisfied, and agreed to proceed.     HPI: Deanna Hunter is a 63 y.o. female who presents for colonoscopy.  Medical history as below.  Tolerated the prep.  No recent chest pain or shortness of breath.  No abdominal pain today.  Past Medical History:  Diagnosis Date   Allergy    Arthritis    possibly right hand    Diabetes mellitus without complication (HCC)     Past Surgical History:  Procedure Laterality Date   NO PAST SURGERIES     WISDOM TOOTH EXTRACTION      Prior to Admission medications   Medication Sig Start Date End Date Taking? Authorizing Provider  metFORMIN  (GLUCOPHAGE -XR) 500 MG 24 hr tablet TAKE TWO TABLETS BY MOUTH TWICE DAILY **NEED OFFICE VISIT** 02/21/23  Yes Norleen Lynwood ORN, MD  Azelastine  HCl 137 MCG/SPRAY SOLN PLACE 2 SPRAYS INTO BOTH NOSTRILS 2 TIMES DAILY AS NEEDED FOR RHINITIS. Patient not taking: Reported on 12/25/2023 02/25/22   Norleen Lynwood ORN, MD  empagliflozin  (JARDIANCE ) 25 MG TABS tablet Take 1 tablet (25 mg total) by mouth daily before breakfast. Patient not taking: Reported on 12/25/2023 02/23/23   Norleen Lynwood ORN, MD  Multiple Vitamins-Minerals (ALIVE ONCE DAILY WOMENS 50+) TABS Take by mouth. Patient not taking: Reported on 12/25/2023    [provider]    Current Outpatient Medications  Medication Sig Dispense Refill   metFORMIN  (GLUCOPHAGE -XR) 500 MG 24 hr tablet TAKE TWO TABLETS BY MOUTH TWICE DAILY **NEED OFFICE VISIT** 360 tablet 3   Azelastine  HCl 137 MCG/SPRAY SOLN PLACE 2 SPRAYS  INTO BOTH NOSTRILS 2 TIMES DAILY AS NEEDED FOR RHINITIS. (Patient not taking: Reported on 12/25/2023) 30 mL 3   empagliflozin  (JARDIANCE ) 25 MG TABS tablet Take 1 tablet (25 mg total) by mouth daily before breakfast. (Patient not taking: Reported on 12/25/2023) 90 tablet 3   Multiple Vitamins-Minerals (ALIVE ONCE DAILY WOMENS 50+) TABS Take by mouth. (Patient not taking: Reported on 12/25/2023)     Current Facility-Administered Medications  Medication Dose Route Frequency Provider Last Rate Last Admin   0.9 %  sodium chloride  infusion  500 mL Intravenous Once Julius Boniface, Gordy HERO, MD        Allergies as of 01/11/2024   (Not on File)    Family History  Problem Relation Age of Onset   Arthritis Mother    Hyperlipidemia Mother    Heart disease Mother    Hypertension Mother    Colon polyps Mother    Skin cancer Mother    Stroke Father    Hypertension Father    Skin cancer Father    Colon polyps Sister    Breast cancer Paternal Aunt    Colon cancer Neg Hx    Esophageal cancer Neg Hx    Rectal cancer Neg Hx    Stomach cancer Neg Hx    Pancreatic cancer Neg Hx    Prostate cancer Neg Hx     Social History   Socioeconomic History   Marital status: Single    Spouse name: Not  on file   Number of children: 0   Years of education: 16   Highest education level: Not on file  Occupational History   Occupation: Catering manager  Tobacco Use   Smoking status: Never   Smokeless tobacco: Never  Vaping Use   Vaping status: Never Used  Substance and Sexual Activity   Alcohol use: No    Alcohol/week: 0.0 standard drinks of alcohol   Drug use: No   Sexual activity: Yes    Birth control/protection: Post-menopausal  Other Topics Concern   Not on file  Social History Narrative   Fun: Does not have a lot to do.    Denies any religious beliefs effecting health care.    Social Drivers of Corporate investment banker Strain: Not on file  Food Insecurity: Not on file  Transportation Needs: Not on  file  Physical Activity: Not on file  Stress: Not on file  Social Connections: Not on file  Intimate Partner Violence: Not on file    Physical Exam: Vital signs in last 24 hours: @BP  (!) 149/109 (BP Location: Right Arm, Patient Position: Sitting, Cuff Size: Normal) Comment: states very nervous  Pulse 85   Temp 98.1 F (36.7 C) (Temporal)   Resp 13   Ht 5' 8 (1.727 m)   Wt 161 lb (73 kg)   SpO2 97%   BMI 24.48 kg/m  GEN: NAD EYE: Sclerae anicteric ENT: MMM CV: Non-tachycardic Pulm: CTA b/l GI: Soft, NT/ND NEURO:  Alert & Oriented x 3   Gordy Starch, MD [facility]  01/11/2024 1:32 PM

## 2024-01-11 NOTE — Progress Notes (Signed)
 Vitals-[place]  Pt's states no medical or surgical changes since previsit or office visit.

## 2024-01-11 NOTE — Op Note (Signed)
 [facility] Patient Name: Deanna Hunter Procedure Date: 01/11/2024 1:24 PM MRN: [id number] Endoscopist: Gordy CHRISTELLA Starch , MD, [id number] Age: 63 Referring MD:  Date of Birth: 02-Jul-1960 Gender: Female Account #: [id number] Procedure:                Colonoscopy Indications:              High risk colon cancer surveillance: Personal                            history of sessile serrated colon polyp (less than                            10 mm in size) with no dysplasia, Last colonoscopy:                            October 2018 (SSP x 3) Medicines:                Monitored Anesthesia Care Procedure:                Pre-Anesthesia Assessment:                           - Prior to the procedure, a History and Physical                            was performed, and patient medications and                            allergies were reviewed. The patient's tolerance of                            previous anesthesia was also reviewed. The risks                            and benefits of the procedure and the sedation                            options and risks were discussed with the patient.                            All questions were answered, and informed consent                            was obtained. Prior Anticoagulants: The patient has                            taken no anticoagulant or antiplatelet agents. ASA                            Grade Assessment: II - A patient with mild systemic                            disease. After reviewing the risks and benefits,  the patient was deemed in satisfactory condition to                            undergo the procedure.                           After obtaining informed consent, the colonoscope                            was passed under direct vision. Throughout the                            procedure, the patient's blood pressure, pulse, and                            oxygen saturations were monitored  continuously. The                            [id number]-HQ190L Colonoscope [id number] was introduced                            through the anus and advanced to the cecum,                            identified by appendiceal orifice and ileocecal                            valve. The colonoscopy was performed without                            difficulty. The patient tolerated the procedure                            well. The quality of the bowel preparation was                            good. The ileocecal valve, appendiceal orifice, and                            rectum were photographed. Scope In: 1:39:22 PM Scope Out: 1:58:18 PM Scope Withdrawal Time: 0 hours 13 minutes 37 seconds  Total Procedure Duration: 0 hours 18 minutes 56 seconds  Findings:                 The digital rectal exam was normal.                           A 4 mm polyp was found in the descending colon. The                            polyp was sessile. The polyp was removed with a                            cold snare. Resection and retrieval were complete.  Multiple medium-mouthed and small-mouthed                            diverticula were found in the sigmoid colon.                           The exam was otherwise without abnormality on                            direct and retroflexion views. Complications:            No immediate complications. Estimated Blood Loss:     Estimated blood loss: none. Impression:               - One 4 mm polyp in the descending colon, removed                            with a cold snare. Resected and retrieved.                           - Moderate diverticulosis in the sigmoid colon.                           - The examination was otherwise normal on direct                            and retroflexion views. Recommendation:           - Patient has a contact number available for                            emergencies. The signs and symptoms of potential                             delayed complications were discussed with the                            patient. Return to normal activities tomorrow.                            Written discharge instructions were provided to the                            patient.                           - Resume previous diet.                           - Continue present medications.                           - Await pathology results.                           - Repeat colonoscopy is recommended for  surveillance. The colonoscopy date will be                            determined after pathology results from today's                            exam become available for review. Gordy CHRISTELLA Starch, MD 01/11/2024 2:03:10 PM This report has been signed electronically.

## 2024-01-11 NOTE — Progress Notes (Signed)
 Called to room to assist during endoscopic procedure.  Patient ID and intended procedure confirmed with present staff. Received instructions for my participation in the procedure from the performing physician.

## 2024-01-11 NOTE — Progress Notes (Signed)
 Sedate, gd SR, tolerated procedure well, VSS, report to RN

## 2024-01-12 ENCOUNTER — Telehealth: Payer: Self-pay | Admitting: *Deleted

## 2024-01-12 NOTE — Telephone Encounter (Signed)
  Follow up Call-     01/11/2024    1:07 PM 01/11/2024    1:02 PM  Call back number  Post procedure Call Back phone  # [phone]   Permission to leave phone message  Yes     Patient questions:  Do you have a fever, pain , or abdominal swelling? No. Pain Score  0 *  Have you tolerated food without any problems? Yes.    Have you been able to return to your normal activities? Yes.    Do you have any questions about your discharge instructions: Diet   No. Medications  No. Follow up visit  No.  Do you have questions or concerns about your Care? No.  Actions: * If pain score is 4 or above: No action needed, pain <4.

## 2024-01-18 LAB — SURGICAL PATHOLOGY

## 2024-01-19 ENCOUNTER — Ambulatory Visit: Payer: Self-pay | Admitting: Internal Medicine

## 2024-01-28 DIAGNOSIS — L509 Urticaria, unspecified: Secondary | ICD-10-CM | POA: Diagnosis not present

## 2024-02-29 ENCOUNTER — Other Ambulatory Visit: Payer: Self-pay | Admitting: Internal Medicine

## 2024-02-29 DIAGNOSIS — Z1231 Encounter for screening mammogram for malignant neoplasm of breast: Secondary | ICD-10-CM

## 2024-03-19 ENCOUNTER — Ambulatory Visit
Admission: RE | Admit: 2024-03-19 | Discharge: 2024-03-19 | Disposition: A | Discharge status: Home / Self Care | Source: Ambulatory Visit | Attending: Internal Medicine | Admitting: Internal Medicine

## 2024-03-19 DIAGNOSIS — Z1231 Encounter for screening mammogram for malignant neoplasm of breast: Secondary | ICD-10-CM | POA: Diagnosis not present

## 2024-04-01 ENCOUNTER — Other Ambulatory Visit

## 2024-04-02 ENCOUNTER — Other Ambulatory Visit (INDEPENDENT_AMBULATORY_CARE_PROVIDER_SITE_OTHER)

## 2024-04-02 DIAGNOSIS — E1165 Type 2 diabetes mellitus with hyperglycemia: Secondary | ICD-10-CM | POA: Diagnosis not present

## 2024-04-02 DIAGNOSIS — E559 Vitamin D deficiency, unspecified: Secondary | ICD-10-CM | POA: Diagnosis not present

## 2024-04-02 DIAGNOSIS — E538 Deficiency of other specified B group vitamins: Secondary | ICD-10-CM | POA: Diagnosis not present

## 2024-04-02 LAB — CBC WITH DIFFERENTIAL/PLATELET
Basophils Absolute: 0 K/uL (ref 0.0–0.1)
Basophils Relative: 0.9 % (ref 0.0–3.0)
Eosinophils Absolute: 0.2 K/uL (ref 0.0–0.7)
Eosinophils Relative: 3.6 % (ref 0.0–5.0)
HCT: 39.9 % (ref 36.0–46.0)
Hemoglobin: 13.7 g/dL (ref 12.0–15.0)
Lymphocytes Relative: 16.6 % (ref 12.0–46.0)
Lymphs Abs: 0.7 K/uL (ref 0.7–4.0)
MCHC: 34.2 g/dL (ref 30.0–36.0)
MCV: 84.4 fl (ref 78.0–100.0)
Monocytes Absolute: 0.4 K/uL (ref 0.1–1.0)
Monocytes Relative: 9.9 % (ref 3.0–12.0)
Neutro Abs: 2.9 K/uL (ref 1.4–7.7)
Neutrophils Relative %: 69 % (ref 43.0–77.0)
Platelets: 230 K/uL (ref 150.0–400.0)
RBC: 4.73 Mil/uL (ref 3.87–5.11)
RDW: 13.7 % (ref 11.5–15.5)
WBC: 4.2 K/uL (ref 4.0–10.5)

## 2024-04-02 LAB — BASIC METABOLIC PANEL WITH GFR
BUN: 14 mg/dL (ref 6–23)
CO2: 25 meq/L (ref 19–32)
Calcium: 9.7 mg/dL (ref 8.4–10.5)
Chloride: 104 meq/L (ref 96–112)
Creatinine, Ser: 0.85 mg/dL (ref 0.40–1.20)
GFR: 74 mL/min (ref >60.00)
Glucose, Bld: 149 mg/dL — ABNORMAL HIGH (ref 70–99)
Potassium: 4.3 meq/L (ref 3.5–5.1)
Sodium: 139 meq/L (ref 135–145)

## 2024-04-02 LAB — LIPID PANEL
Cholesterol: 278 mg/dL — ABNORMAL HIGH (ref 0–200)
HDL: 60 mg/dL (ref >39.00)
LDL Cholesterol: 193 mg/dL — ABNORMAL HIGH (ref 0–99)
NonHDL: 217.89
Total CHOL/HDL Ratio: 5
Triglycerides: 126 mg/dL (ref 0.0–149.0)
VLDL: 25.2 mg/dL (ref 0.0–40.0)

## 2024-04-02 LAB — HEPATIC FUNCTION PANEL
ALT: 14 U/L (ref 0–35)
AST: 14 U/L (ref 0–37)
Albumin: 4.6 g/dL (ref 3.5–5.2)
Alkaline Phosphatase: 68 U/L (ref 39–117)
Bilirubin, Direct: 0 mg/dL (ref 0.0–0.3)
Total Bilirubin: 0.5 mg/dL (ref 0.2–1.2)
Total Protein: 7.1 g/dL (ref 6.0–8.3)

## 2024-04-02 LAB — URINALYSIS, ROUTINE W REFLEX MICROSCOPIC
Bilirubin Urine: NEGATIVE
Hgb urine dipstick: NEGATIVE
Ketones, ur: 15 — AB
Nitrite: NEGATIVE
Specific Gravity, Urine: 1.02 (ref 1.000–1.030)
Total Protein, Urine: NEGATIVE
Urine Glucose: NEGATIVE
Urobilinogen, UA: 0.2 (ref 0.0–1.0)
pH: 5.5 (ref 5.0–8.0)

## 2024-04-02 LAB — HEMOGLOBIN A1C: Hgb A1c MFr Bld: 6.5 % (ref 4.6–6.5)

## 2024-04-02 LAB — TSH: TSH: 1.78 u[IU]/mL (ref 0.35–5.50)

## 2024-04-02 LAB — MICROALBUMIN / CREATININE URINE RATIO
Creatinine,U: 123.6 mg/dL
Microalb Creat Ratio: 5.8 mg/g (ref 0.0–30.0)
Microalb, Ur: 0.7 mg/dL (ref 0.0–1.9)

## 2024-04-02 LAB — VITAMIN D 25 HYDROXY (VIT D DEFICIENCY, FRACTURES): VITD: 31.98 ng/mL (ref 30.00–100.00)

## 2024-04-02 LAB — VITAMIN B12: Vitamin B-12: 259 pg/mL (ref 211–911)

## 2024-04-04 ENCOUNTER — Ambulatory Visit: Admitting: Internal Medicine

## 2024-04-04 ENCOUNTER — Encounter: Payer: Self-pay | Admitting: Internal Medicine

## 2024-04-04 VITALS — BP 136/82 | HR 88 | Temp 98.0°F | Ht 68.0 in | Wt 162.0 lb

## 2024-04-04 DIAGNOSIS — E7800 Pure hypercholesterolemia, unspecified: Secondary | ICD-10-CM | POA: Diagnosis not present

## 2024-04-04 DIAGNOSIS — Z7984 Long term (current) use of oral hypoglycemic drugs: Secondary | ICD-10-CM

## 2024-04-04 DIAGNOSIS — E1165 Type 2 diabetes mellitus with hyperglycemia: Secondary | ICD-10-CM

## 2024-04-04 DIAGNOSIS — E559 Vitamin D deficiency, unspecified: Secondary | ICD-10-CM | POA: Diagnosis not present

## 2024-04-04 DIAGNOSIS — Z0001 Encounter for general adult medical examination with abnormal findings: Secondary | ICD-10-CM

## 2024-04-04 DIAGNOSIS — E538 Deficiency of other specified B group vitamins: Secondary | ICD-10-CM | POA: Diagnosis not present

## 2024-04-04 DIAGNOSIS — Z0000 Encounter for general adult medical examination without abnormal findings: Secondary | ICD-10-CM

## 2024-04-04 MED ORDER — VITAMIN D (ERGOCALCIFEROL) 1.25 MG (50000 UNIT) PO CAPS: 50000.0000 [IU] | ORAL_CAPSULE | ORAL | 1 refills | Status: AC

## 2024-04-04 MED ORDER — METFORMIN HCL ER 500 MG PO TB24: ORAL_TABLET | ORAL | 3 refills | Status: AC

## 2024-04-04 NOTE — Assessment & Plan Note (Signed)
 Lab Results  Component Value Date   LDLCALC 193 (H) 04/02/2024   Severe uncontrolled, pt for lower chol diet, declines statin, also for Card CT score

## 2024-04-04 NOTE — Assessment & Plan Note (Signed)
 Last vitamin D  Lab Results  Component Value Date   VD25OH 31.98 04/02/2024   Low, to start oral replacement

## 2024-04-04 NOTE — Progress Notes (Signed)
 Patient ID: Deanna Hunter, female   DOB: 09/24/1960, 63 y.o.   MRN: [id number]         Chief Complaint:: wellness exam and low vit d, low b12, hyperglycemia, hld       HPI:  Deanna Hunter is a 63 y.o. female here for wellness exam; sees GYN yearly in oct 2005; pt to call for eye exam soon, for tdap, prevnar and flu at pharmacy, declines hep B, o/w up to date                        Also Pt denies chest pain, increased sob or doe, wheezing, orthopnea, PND, increased LE swelling, palpitations, dizziness or syncope.   Pt denies polydipsia, polyuria, or new focal neuro s/s.    Pt denies fever, wt loss, night sweats, loss of appetite, or other constitutional symptoms   Wt Readings from Last 3 Encounters:  04/04/24 162 lb (73.5 kg)  01/11/24 161 lb (73 kg)  12/25/23 161 lb (73 kg)   BP Readings from Last 3 Encounters:  04/04/24 136/82  01/11/24 (!) 149/86  02/22/23 (!) 184/100   Immunization History  Administered Date(s) Administered   Influenza,inj,Quad PF,6+ Mos 04/28/2017, 02/21/2019   Moderna Sars-Covid-2 Vaccination 07/15/2019, 08/12/2019   Zoster Recombinant(Shingrix) 05/04/2017, 07/13/2017   Health Maintenance Due  Topic Date Due   OPHTHALMOLOGY EXAM  Never done   DTaP/Tdap/Td (1 - Tdap) Never done   Pneumococcal Vaccine: 50+ Years (1 of 2 - PCV) Never done   Cervical Cancer Screening (HPV/Pap Cotest)  Never done   Hepatitis B Vaccines 19-59 Average Risk (1 of 3 - Risk 3-dose series) Never done      Past Medical History:  Diagnosis Date   Allergy    Arthritis    possibly right hand    Diabetes mellitus without complication (HCC)    Past Surgical History:  Procedure Laterality Date   NO PAST SURGERIES     WISDOM TOOTH EXTRACTION      reports that she has never smoked. She has never used smokeless tobacco. She reports that she does not drink alcohol and does not use drugs. family history includes Arthritis in her mother; Breast cancer in her paternal  aunt; Colon polyps in her mother and sister; Heart disease in her mother; Hyperlipidemia in her mother; Hypertension in her father and mother; Skin cancer in her father and mother; Stroke in her father. Not on File Current Outpatient Medications on File Prior to Visit  Medication Sig Dispense Refill   Azelastine  HCl 137 MCG/SPRAY SOLN PLACE 2 SPRAYS INTO BOTH NOSTRILS 2 TIMES DAILY AS NEEDED FOR RHINITIS. (Patient not taking: Reported on 04/04/2024) 30 mL 3   folic acid (FOLVITE) 1 MG tablet Take 1 mg by mouth daily. (Patient not taking: Reported on 04/04/2024)     Multiple Vitamins-Minerals (ALIVE ONCE DAILY WOMENS 50+) TABS Take by mouth. (Patient not taking: Reported on 04/04/2024)     No current facility-administered medications on file prior to visit.        ROS:  All others reviewed and negative.  Objective        PE:  BP 136/82 (BP Location: Left Arm, Patient Position: Sitting, Cuff Size: Normal)   Pulse 88   Temp 98 F (36.7 C) (Oral)   Ht 5' 8 (1.727 m)   Wt 162 lb (73.5 kg)   SpO2 99%   BMI 24.63 kg/m  Constitutional: Pt appears in NAD               HENT: Head: NCAT.                Right Ear: External ear normal.                 Left Ear: External ear normal.                Eyes: . Pupils are equal, round, and reactive to light. Conjunctivae and EOM are normal               Nose: without d/c or deformity               Neck: Neck supple. Gross normal ROM               Cardiovascular: Normal rate and regular rhythm.                 Pulmonary/Chest: Effort normal and breath sounds without rales or wheezing.                Abd:  Soft, NT, ND, + BS, no organomegaly               Neurological: Pt is alert. At baseline orientation, motor grossly intact               Skin: Skin is warm. No rashes, no other new lesions, LE edema - none               Psychiatric: Pt behavior is normal without agitation   Micro: none  Cardiac tracings I have personally  interpreted today:  none  Pertinent Radiological findings (summarize): none   Lab Results  Component Value Date   WBC 4.2 04/02/2024   HGB 13.7 04/02/2024   HCT 39.9 04/02/2024   PLT 230.0 04/02/2024   GLUCOSE 149 (H) 04/02/2024   CHOL 278 (H) 04/02/2024   TRIG 126.0 04/02/2024   HDL 60.00 04/02/2024   LDLCALC 193 (H) 04/02/2024   ALT 14 04/02/2024   AST 14 04/02/2024   NA 139 04/02/2024   K 4.3 04/02/2024   CL 104 04/02/2024   CREATININE 0.85 04/02/2024   BUN 14 04/02/2024   CO2 25 04/02/2024   TSH 1.78 04/02/2024   HGBA1C 6.5 04/02/2024   MICROALBUR 0.7 04/02/2024   Assessment/Plan:  Deanna Hunter is a 63 y.o. White or Caucasian [1] female with  has a past medical history of Allergy, Arthritis, and Diabetes mellitus without complication (HCC).  Encounter for well adult exam with abnormal findings Age and sex appropriate education and counseling updated with regular exercise and diet Referrals for preventative services - for GYN next October f/u, pt to call for eye exam Immunizations addressed - for tdap and prevnar at pharmacy, declines hep B Smoking counseling  - none needed Evidence for depression or other mood disorder - none significant Most recent labs reviewed. I have personally reviewed and have noted: 1) the patient's medical and social history 2) The patient's current medications and supplements 3) The patient's height, weight, and BMI have been recorded in the chart   Diabetes (HCC) With hyperglycemia, without insulin  Lab Results  Component Value Date   HGBA1C 6.5 04/02/2024   Stable, pt to continue current medical treatment metformin  ER 500 mg - 2 bid   HLD (hyperlipidemia) Lab Results  Component Value Date   LDLCALC 193 (H) 04/02/2024   Severe uncontrolled, pt  for lower chol diet, declines statin, also for Card CT score   Vitamin D  deficiency Last vitamin D  Lab Results  Component Value Date   VD25OH 31.98 04/02/2024   Low, to  start oral replacement   B12 deficiency Lab Results  Component Value Date   VITAMINB12 259 04/02/2024   Low, to start oral replacement - b12 1000 mcg every day x 6 mo  Followup: Return in about 1 year (around 04/04/2025).  Lynwood Rush, MD 04/04/2024 8:51 PM [facility]

## 2024-04-04 NOTE — Assessment & Plan Note (Signed)
 Age and sex appropriate education and counseling updated with regular exercise and diet Referrals for preventative services - for GYN next October f/u, pt to call for eye exam Immunizations addressed - for tdap and prevnar at pharmacy, declines hep B Smoking counseling  - none needed Evidence for depression or other mood disorder - none significant Most recent labs reviewed. I have personally reviewed and have noted: 1) the patient's medical and social history 2) The patient's current medications and supplements 3) The patient's height, weight, and BMI have been recorded in the chart

## 2024-04-04 NOTE — Assessment & Plan Note (Signed)
 With hyperglycemia, without insulin  Lab Results  Component Value Date   HGBA1C 6.5 04/02/2024   Stable, pt to continue current medical treatment metformin  ER 500 mg - 2 bid

## 2024-04-04 NOTE — Assessment & Plan Note (Signed)
 Lab Results  Component Value Date   VITAMINB12 259 04/02/2024   Low, to start oral replacement - b12 1000 mcg every day x 6 mo

## 2024-04-04 NOTE — Patient Instructions (Addendum)
 Please remember to call for your eye exam soon  Please have your Flu and Pneumonia shots done at the pharmacy, and consider the Tdap tetanus as well  Please take all new medication as prescribed-  the Vit D 50K units once per wk  Please also take OTC B12 1000 mcg per day for 6 months only  Please continue all other medications as before, and refills have been done if requested.  Please have the pharmacy call with any other refills you may need.  Please continue your efforts at being more active, low cholesterol diet, and weight control.  You are otherwise up to date with prevention measures today.  Please keep your appointments with your specialists as you may have planned  You will be contacted regarding the referral for: Cardiac CT score  Please make an Appointment to return for your 1 year visit, or sooner if needed, with Lab testing by Appointment as well, to be done about 3-5 days before at the FIRST FLOOR Lab (so this is for TWO appointments - please see the scheduling desk as you leave)
